# Patient Record
Sex: Female | Born: 2000 | Race: Black or African American | Hispanic: No | Marital: Single | Smoking: Never smoker
Health system: Southern US, Community
[De-identification: ages and names within clinical notes are randomized; demographics above are authoritative.]

## PROBLEM LIST (undated history)

## (undated) DIAGNOSIS — E119 Type 2 diabetes mellitus without complications: Secondary | ICD-10-CM

## (undated) HISTORY — PX: FOOT SURGERY: SHX648

## (undated) HISTORY — PX: TONSILLECTOMY: SUR1361

---
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
## 2019-02-21 ENCOUNTER — Encounter: Payer: Self-pay | Admitting: Dietician

## 2019-02-21 ENCOUNTER — Encounter: Payer: BC Managed Care – PPO | Attending: Internal Medicine | Admitting: Dietician

## 2019-02-21 ENCOUNTER — Other Ambulatory Visit: Payer: Self-pay

## 2019-02-21 VITALS — BP 112/76 | Ht 63.0 in | Wt 207.9 lb

## 2019-02-21 DIAGNOSIS — Z794 Long term (current) use of insulin: Secondary | ICD-10-CM

## 2019-02-21 DIAGNOSIS — E119 Type 2 diabetes mellitus without complications: Secondary | ICD-10-CM | POA: Insufficient documentation

## 2019-02-21 DIAGNOSIS — Z713 Dietary counseling and surveillance: Secondary | ICD-10-CM | POA: Diagnosis not present

## 2019-02-21 DIAGNOSIS — Z68.41 Body mass index (BMI) pediatric, greater than or equal to 95th percentile for age: Secondary | ICD-10-CM | POA: Insufficient documentation

## 2019-02-21 NOTE — Patient Instructions (Addendum)
     Bring Dexcom blood sugar records to classes  Exercise:  Continue kickboxing for    60 minutes   3 times a week                         -ride bike or walk 30 min. 1-2 days/wk  Eat 3 meals day and   1  snack a day at bedtime  Space meals 4-5 hours apart  Avoid sugar sweetened drinks (soda, tea, coffee, sports drinks, juices)  Limit intake of fried foods, snack foods and sweets/desserts  Eat 2-3 carbohydrate servings/meal + protein  Eat 1 carbohydrate serving/snack + protein  Drink plenty of water  Make a dentist appointment  Get a Sharps container  Carry fast acting glucose and a snack at all times  Carry medical alert ID at all times  When able, take Novolog 15-20 min. before eating meals  Rotate injection sites  Return for appointment/classes on:  03-07-19

## 2019-02-21 NOTE — Progress Notes (Signed)
Diabetes Self-Management Education  Visit Type: First/Initial  Appt. Start Time: 1315 Appt. End Time: 6962  02/21/2019  [name], identified by name and date of birth, is a 18 y.o. female with a diagnosis of Diabetes: Type 2.   ASSESSMENT  Blood pressure 112/76, height 5\' 3"  (1.6 m), weight 207 lb 14.4 oz (94.3 kg). Body mass index is 36.83 kg/m.  Diabetes Self-Management Education - 02/21/19 1438      Visit Information   Visit Type  First/Initial      Initial Visit   Diabetes Type  Type 2      Health Coping   How would you rate your overall health?  Fair      Psychosocial Assessment   Patient Belief/Attitude about Diabetes  Motivated to manage diabetes    Self-care barriers  None    Self-management support  Doctor's office;Family    Other persons present  Patient    Patient Concerns  Glycemic Control;Medication;Weight Control   prevent complicatons, become more fit   Special Needs  None    Preferred Learning Style  Visual;Auditory    Learning Readiness  Ready    What is the last grade level you completed in school?  high school      Pre-Education Assessment   Patient understands the diabetes disease and treatment process.  Needs Review    Patient understands incorporating nutritional management into lifestyle.  Needs Review    Patient undertands incorporating physical activity into lifestyle.  Needs Review    Patient understands using medications safely.  Needs Review    Patient understands monitoring blood glucose, interpreting and using results  Needs Review   wears dexcom   Patient understands prevention, detection, and treatment of acute complications.  Needs Review    Patient understands prevention, detection, and treatment of chronic complications.  Needs Review    Patient understands how to develop strategies to address psychosocial issues.  Needs Review    Patient understands how to develop strategies to promote health/change behavior.  Needs Review       Complications   Last HgB A1C per patient/outside source  14 %    How often do you check your blood sugar?  --   wears dexcom-reports FBG's mostly 100's-150's and 2 hr pp BG's 200's; occasional low BG's   Have you had a dilated eye exam in the past 12 months?  No   08-2017   Have you had a dental exam in the past 12 months?  Yes   07-2018   Are you checking your feet?  No      Dietary Intake   Breakfast  eats 2 eggs with cheese and vegetables at 9a-10a    Snack (morning)  eats sunflower seeds or chocolate candy or sweets/snack foods at 12p    Lunch  eats meal leftovers or cereal with milk at 1p-2p    Snack (afternoon)  none    Dinner  usually eats meat, rice and beans at 6p-8p    Snack (evening)  occasionally eats sweets/desserts at 9p    Beverage(s)  drinks water 5x/day, fruit juice 1x/day, regular sodas 2-3x/day, milk x1/day      Exercise   Exercise Type  ADL's;Strenuous (running)   kickboxing 1 hr 3x/wk   How many days per week to you exercise?  3    How many minutes per day do you exercise?  60    Total minutes per week of exercise  180  Patient Education   Previous Diabetes Education  No    Nutrition management   Role of diet in the treatment of diabetes and the relationship between the three main macronutrients and blood glucose level;Food label reading, portion sizes and measuring food.;Carbohydrate counting    Physical activity and exercise   Role of exercise on diabetes management, blood pressure control and cardiac health.    Medications  Taught/reviewed insulin injection, site rotation, insulin storage and needle disposal.;Reviewed patients medication for diabetes, action, purpose, timing of dose and side effects.    Monitoring  Yearly dilated eye exam   reviewed use of dexcom and using clarity for BG reports   Acute complications  Taught treatment of hypoglycemia - the 15 rule.   gave pt 1 tube of glucose tablets for PRN use; gave pt medical alert ID card and  coupon   Chronic complications  Relationship between chronic complications and blood glucose control    Personal strategies to promote health  Lifestyle issues that need to be addressed for better diabetes care;Helped patient develop diabetes management plan for (enter comment)      Outcomes   Expected Outcomes  Demonstrated limited interest in learning.  Expect minimal changes       Individualized Plan for Diabetes Self-Management Training:   Learning Objective:  Patient will have a greater understanding of diabetes self-management. Patient education plan is to attend individual and/or group sessions per assessed needs and concerns.   Plan:   Patient Instructions    Bring Dexcom blood sugar records to classes  Exercise:  Continue kickboxing for    60 minutes   3 times a week                         -ride bike or walk 30 min. 1-2 days/wk  Eat 3 meals day and   1  snack a day at bedtime  Space meals 4-5 hours apart  Avoid sugar sweetened drinks (soda, tea, coffee, sports drinks, juices)  Limit intake of fried foods, snack foods and sweets/desserts  Eat 2-3 carbohydrate servings/meal + protein  Eat 1 carbohydrate serving/snack + protein  Drink plenty of water  Make a dentist  appointment  Get a Sharps container  Carry fast acting glucose and a snack at all times  Carry medical alert ID at all times  When able, take Novolog 15-20 min. before eating meals  Rotate injection sites  Return for appointment/classes on:  03-07-19   Expected Outcomes:  Demonstrated limited interest in learning.  Expect minimal changes  Education material provided: General meal planning guidelines, Food Group handout, low BG handout, medical alert ID card, medical alert necklace coupon  If problems or questions, patient to contact team via: [phone]  Future DSME appointment:  03-07-19

## 2019-03-07 ENCOUNTER — Other Ambulatory Visit: Payer: Self-pay

## 2019-03-07 ENCOUNTER — Encounter: Payer: Self-pay | Admitting: Dietician

## 2019-03-07 ENCOUNTER — Encounter: Payer: BC Managed Care – PPO | Attending: Internal Medicine | Admitting: Dietician

## 2019-03-07 VITALS — Ht 63.0 in | Wt 213.5 lb

## 2019-03-07 DIAGNOSIS — E119 Type 2 diabetes mellitus without complications: Secondary | ICD-10-CM | POA: Diagnosis not present

## 2019-03-07 DIAGNOSIS — Z794 Long term (current) use of insulin: Secondary | ICD-10-CM

## 2019-03-07 DIAGNOSIS — Z68.41 Body mass index (BMI) pediatric, greater than or equal to 95th percentile for age: Secondary | ICD-10-CM | POA: Diagnosis not present

## 2019-03-07 DIAGNOSIS — Z713 Dietary counseling and surveillance: Secondary | ICD-10-CM | POA: Diagnosis present

## 2019-03-07 NOTE — Progress Notes (Signed)
Appt. Start Time: 1730 Appt. End Time: 1950  Class 1 Diabetes Overview - define DM; state own type of DM; identify functions of pancreas and insulin; define insulin deficiency vs insulin resistance  Psychosocial - identify DM as a source of stress; state the effects of stress on BG control  Nutritional Management - describe effects of food on blood glucose; identify sources of carbohydrate, protein and fat; verbalize the importance of balance meals in controlling blood glucose  Exercise - describe the effects of exercise on blood glucose and importance of regular exercise in controlling diabetes; state a plan for personal exercise; verbalize contraindications for exercise  Self-Monitoring - state importance of SMBG; use SMBG results to effectively manage diabetes; identify importance of regular HbA1C testing and goals for results  Acute Complications - recognize hyperglycemia and hypoglycemia with causes and effects; identify blood glucose results as high, low or in control; list steps in treating and preventing high and low blood glucose  Chronic Complications/Foot, Skin, Eye Dental Care - identify possible long-term complications of diabetes (retinopathy, neuropathy, nephropathy, cardiovascular disease, infections); state importance of daily self-foot exams; describe how to examine feet and what to look for; explain appropriate eye and dental care  Lifestyle Changes/Goals & Health/Community Resources - state benefits of making appropriate lifestyle changes; identify habits that need to change (meals, tobacco, alcohol); identify strategies to reduce risk factors for personal health  Pregnancy/Sexual Health - define gestational diabetes; state importance of good blood glucose control and birth control prior to pregnancy; state importance of good blood glucose control in preventing sexual problems (impotence, vaginal dryness, infections, loss of desire); state relationship of blood glucose control  and pregnancy outcome; describe risk of maternal and fetal complications  Teaching Materials Used: Class 1 Slides/Notebook Diabetes Booklet ID Card  Medic Alert/Medic ID Forms Sleep Evaluation Exercise Handout Planning a Balanced Meal Goals for Class 1

## 2019-03-14 ENCOUNTER — Other Ambulatory Visit: Payer: Self-pay

## 2019-03-14 ENCOUNTER — Encounter: Payer: Self-pay | Admitting: Dietician

## 2019-03-14 NOTE — Progress Notes (Signed)
Patient did not come for class 2 as scheduled for today. Attempted to reach patient by phone but call was unable to be completed.

## 2019-03-17 ENCOUNTER — Telehealth: Payer: Self-pay | Admitting: Dietician

## 2019-03-17 NOTE — Telephone Encounter (Signed)
Called patient to reschedule her diabetes class 2, which she missed on 03/14/19. Received a busy signal after several rings.

## 2019-03-21 ENCOUNTER — Telehealth: Payer: Self-pay | Admitting: Dietician

## 2019-03-21 ENCOUNTER — Other Ambulatory Visit: Payer: Self-pay

## 2019-03-21 NOTE — Telephone Encounter (Signed)
Patient did not come for her Diabetes Self management class 3. Called and phone rings and then goes to "busy" signal.

## 2019-03-28 ENCOUNTER — Encounter: Payer: Self-pay | Admitting: Dietician

## 2019-03-28 NOTE — Progress Notes (Signed)
Have not heard from pt after attempts were made on 03-14-19, 03-17-19 and 03-21-19 to call her-each time her phone rings and then goes to a busy signal

## 2019-12-13 ENCOUNTER — Encounter (INDEPENDENT_AMBULATORY_CARE_PROVIDER_SITE_OTHER): Payer: Self-pay

## 2019-12-13 ENCOUNTER — Ambulatory Visit
Admission: RE | Admit: 2019-12-13 | Discharge: 2019-12-13 | Disposition: A | Discharge status: Home / Self Care | Payer: Medicaid Other | Source: Ambulatory Visit | Attending: Family Medicine | Admitting: Family Medicine

## 2019-12-13 ENCOUNTER — Other Ambulatory Visit: Payer: Self-pay

## 2019-12-13 ENCOUNTER — Other Ambulatory Visit: Payer: Self-pay | Admitting: Family Medicine

## 2019-12-13 DIAGNOSIS — R1011 Right upper quadrant pain: Secondary | ICD-10-CM | POA: Insufficient documentation

## 2021-03-16 ENCOUNTER — Other Ambulatory Visit: Payer: Self-pay

## 2021-03-16 ENCOUNTER — Emergency Department
Admission: EM | Admit: 2021-03-16 | Discharge: 2021-03-16 | Disposition: A | Discharge status: Home / Self Care | Payer: Medicaid Other | Attending: Student in an Organized Health Care Education/Training Program | Admitting: Student in an Organized Health Care Education/Training Program

## 2021-03-16 ENCOUNTER — Encounter: Payer: Self-pay | Admitting: Emergency Medicine

## 2021-03-16 DIAGNOSIS — W4904XA Ring or other jewelry causing external constriction, initial encounter: Secondary | ICD-10-CM | POA: Diagnosis not present

## 2021-03-16 DIAGNOSIS — S00451A Superficial foreign body of right ear, initial encounter: Secondary | ICD-10-CM

## 2021-03-16 DIAGNOSIS — E119 Type 2 diabetes mellitus without complications: Secondary | ICD-10-CM | POA: Insufficient documentation

## 2021-03-16 DIAGNOSIS — Z794 Long term (current) use of insulin: Secondary | ICD-10-CM | POA: Diagnosis not present

## 2021-03-16 DIAGNOSIS — S0991XA Unspecified injury of ear, initial encounter: Secondary | ICD-10-CM | POA: Diagnosis present

## 2021-03-16 MED ORDER — LIDOCAINE HCL (PF) 1 % IJ SOLN
5.0000 mL | Freq: Once | INTRAMUSCULAR | Status: AC
  Administered 2021-03-16: 5 mL
  Filled 2021-03-16: qty 5

## 2021-03-16 NOTE — ED Provider Notes (Signed)
[facility] Emergency Department Provider Note ____________________________________________  Time seen: 1654  I have reviewed the triage vital signs and the nursing notes.  HISTORY  Chief Complaint  Foreign Body in Skin   HPI Donna Mcguire is a 20 y.o. female presents to the ED for evaluation of an embedded earring to the right earlobe.  Patient reports a stud piercing to the pinna of the ear, has been present for the last year.  She notes over the last 24 hours, after sleeping on her right side, she noted some edema to the ear, and the inability to remove the earring comfortably.  She presents now with some local bleeding, swelling, and concern for overgrowth of skin over the earring stud.  She denies any other injury at this time.  Past Medical History:  Diagnosis Date   Diabetes mellitus without complication (HCC)     There are no problems to display for this patient.   Past Surgical History:  Procedure Laterality Date   TONSILLECTOMY      Prior to Admission medications   Medication Sig Start Date End Date Taking? Authorizing Provider  glucagon 1 MG injection Inject 1 mg into the muscle as needed. 11/05/15   [provider]  insulin aspart (NOVOLOG FLEXPEN) 100 UNIT/ML FlexPen Inject 20 Units into the skin 3 (three) times daily before meals. Patient taking 20 units before breakfast and lunch, 30 units before supper 02/26/18   [provider]  Insulin Glargine (LANTUS SOLOSTAR) 100 UNIT/ML Solostar Pen Inject 40 Units into the skin at bedtime.    [provider]  Vitamin D, Ergocalciferol, (DRISDOL) 1.25 MG (50000 UT) CAPS capsule Take 50,000 Units by mouth once a week. 01/27/18   [provider]    Allergies Patient has no known allergies.  History reviewed. No pertinent family history.  Social History Social History   Tobacco Use   Smoking status: Never   Smokeless tobacco: Never  Substance Use Topics   Alcohol  use: Never    Review of Systems  Constitutional: Negative for fever. Eyes: Negative for visual changes. ENT: Negative for sore throat.  Right earlobe foreign body as above. Cardiovascular: Negative for chest pain. Respiratory: Negative for shortness of breath. Gastrointestinal: Negative for abdominal pain, vomiting and diarrhea. Genitourinary: Negative for dysuria. Musculoskeletal: Negative for back pain. Skin: Negative for rash. Neurological: Negative for headaches, focal weakness or numbness. ____________________________________________  PHYSICAL EXAM:  VITAL SIGNS: ED Triage Vitals  Enc Vitals Group     BP 03/16/21 1430 130/82     Pulse Rate 03/16/21 1430 87     Resp 03/16/21 1430 20     Temp 03/16/21 1430 98.3 F (36.8 C)     Temp Source 03/16/21 1430 Oral     SpO2 03/16/21 1430 99 %     Weight 03/16/21 1429 228 lb (103.4 kg)     Height 03/16/21 1429 5\' 3"  (1.6 m)     Head Circumference --      Peak Flow --      Pain Score 03/16/21 1429 6     Pain Loc --      Pain Edu? --      Excl. in GC? --     Constitutional: Alert and oriented. Well appearing and in no distress. Head: Normocephalic and atraumatic. Eyes: Conjunctivae are normal. Normal extraocular movements Ears: Canals clear. TMs intact bilaterally.  Right ear with an embedded earring to the pinna with surrounding dried blood.  The follow-up of  the earring is partially obscured by hypertrophic skin. Cardiovascular: Normal rate, regular rhythm. Normal distal pulses. Respiratory: Normal respiratory effort.  Musculoskeletal: Nontender with normal range of motion in all extremities.  Neurologic:  Normal gait without ataxia. Normal speech and language. No gross focal neurologic deficits are appreciated. Skin:  Skin is warm, dry and intact. No rash noted. ____________________________________________   RADIOLOGY Official radiology report(s): No results  found. ____________________________________________  PROCEDURES   .Foreign Body Removal  Date/Time: 03/16/2021 4:36 PM Performed by: Lissa Hoard, PA-C Authorized by: Lissa Hoard, PA-C  Consent: Verbal consent obtained. Written consent not obtained. Risks and benefits: risks, benefits and alternatives were discussed Consent given by: patient Patient understanding: patient states understanding of the procedure being performed Patient consent: the patient's understanding of the procedure matches consent given Site marked: the operative site was marked Patient identity confirmed: verbally with patient Body area: ear Location details: right ear Anesthesia: local infiltration  Anesthesia: Local Anesthetic: lidocaine 1% without epinephrine Anesthetic total: 0.7 mL  Sedation: Patient sedated: no  Patient restrained: no Patient cooperative: yes Localization method: visualized Removal mechanism: forceps Complexity: simple 1 objects recovered. Objects recovered: silver-tone stud earring w/ back Post-procedure assessment: foreign body removed Patient tolerance: patient tolerated the procedure well with no immediate complications  ____________________________________________   INITIAL IMPRESSION / ASSESSMENT AND PLAN / ED COURSE  As part of my medical decision making, I reviewed the following data within the electronic MEDICAL RECORD NUMBER Notes from prior ED visits    Patient with ED evaluation of embedded earring to the pinna of the right earlobe.  Patient presents for evaluation of her complaint.  The foreign body removed successfully without difficulty.  Patient discharged with instruction to monitor for any skin changes.  She will follow with primary provider or return to the ED if needed.    Donna Mcguire was evaluated in Emergency Department on 03/16/2021 for the symptoms described in the history of present illness. She was evaluated in the context of  the global COVID-19 pandemic, which necessitated consideration that the patient might be at risk for infection with the SARS-CoV-2 virus that causes COVID-19. Institutional protocols and algorithms that pertain to the evaluation of patients at risk for COVID-19 are in a state of rapid change based on information released by regulatory bodies including the CDC and federal and state organizations. These policies and algorithms were followed during the patient's care in the ED. ____________________________________________  FINAL CLINICAL IMPRESSION(S) / ED DIAGNOSES  Final diagnoses:  Embedded earring of right ear, initial encounter      Lissa Hoard, PA-C 03/16/21 1759    Willy Eddy, MD 03/16/21 [phone]

## 2021-03-16 NOTE — Discharge Instructions (Addendum)
We successfully removed an embedded earring from the ear. Keep the wound clean.

## 2021-03-16 NOTE — ED Provider Notes (Signed)
Emergency Medicine Provider Triage Evaluation Note  Donna Mcguire , a 20 y.o. female  was evaluated in triage.  Pt complains of pain in the right ear due to earring. Skin has grown over it and she has tried to get it out without success. Earrings have been in place for the last year, but she noticed the overgrown skin today.  Review of Systems  Positive: FB right ear Negative: Fever  Physical Exam  BP 130/82 (BP Location: Left Arm)   Pulse 87   Temp 98.3 F (36.8 C) (Oral)   Resp 20   Ht 5\' 3"  (1.6 m)   Wt 103.4 kg   LMP 02/23/2021 (Approximate)   SpO2 99%   BMI 40.39 kg/m  Gen:   Awake, no distress   Resp:  Normal effort  MSK:   Moves extremities without difficulty  Other:  Unable to easily visualize reported FB. Dried blood noted on auricle of right ear.  Medical Decision Making  Medically screening exam initiated at 2:31 PM.  Appropriate orders placed.  Donna Mcguire was informed that the remainder of the evaluation will be completed by another provider, this initial triage assessment does not replace that evaluation, and the importance of remaining in the ED until their evaluation is complete.    Donna Payment, FNP 03/16/21 1529    03/18/21, MD 03/16/21 [phone]

## 2021-04-02 ENCOUNTER — Encounter (HOSPITAL_BASED_OUTPATIENT_CLINIC_OR_DEPARTMENT_OTHER): Payer: Self-pay | Admitting: *Deleted

## 2021-04-02 ENCOUNTER — Other Ambulatory Visit: Payer: Self-pay

## 2021-04-02 ENCOUNTER — Emergency Department (HOSPITAL_BASED_OUTPATIENT_CLINIC_OR_DEPARTMENT_OTHER)
Admission: EM | Admit: 2021-04-02 | Discharge: 2021-04-02 | Disposition: A | Discharge status: Home / Self Care | Payer: Medicaid Other | Attending: Emergency Medicine | Admitting: Emergency Medicine

## 2021-04-02 ENCOUNTER — Emergency Department (HOSPITAL_BASED_OUTPATIENT_CLINIC_OR_DEPARTMENT_OTHER): Payer: Medicaid Other

## 2021-04-02 DIAGNOSIS — R0789 Other chest pain: Secondary | ICD-10-CM | POA: Diagnosis not present

## 2021-04-02 DIAGNOSIS — Z794 Long term (current) use of insulin: Secondary | ICD-10-CM | POA: Diagnosis not present

## 2021-04-02 DIAGNOSIS — Z7722 Contact with and (suspected) exposure to environmental tobacco smoke (acute) (chronic): Secondary | ICD-10-CM | POA: Insufficient documentation

## 2021-04-02 DIAGNOSIS — E119 Type 2 diabetes mellitus without complications: Secondary | ICD-10-CM | POA: Diagnosis not present

## 2021-04-02 DIAGNOSIS — M5412 Radiculopathy, cervical region: Secondary | ICD-10-CM | POA: Insufficient documentation

## 2021-04-02 DIAGNOSIS — R202 Paresthesia of skin: Secondary | ICD-10-CM | POA: Diagnosis not present

## 2021-04-02 LAB — BASIC METABOLIC PANEL
Anion gap: 9 (ref 5–15)
BUN: 17 mg/dL (ref 6–20)
CO2: 26 mmol/L (ref 22–32)
Calcium: 9.4 mg/dL (ref 8.9–10.3)
Chloride: 102 mmol/L (ref 98–111)
Creatinine, Ser: 0.75 mg/dL (ref 0.44–1.00)
GFR, Estimated: >60 mL/min (ref >60)
Glucose, Bld: 125 mg/dL — ABNORMAL HIGH (ref 70–99)
Potassium: 4.1 mmol/L (ref 3.5–5.1)
Sodium: 137 mmol/L (ref 135–145)

## 2021-04-02 LAB — TROPONIN I (HIGH SENSITIVITY): Troponin I (High Sensitivity): <2 ng/L (ref <18)

## 2021-04-02 LAB — CBC
HCT: 39 % (ref 36.0–46.0)
Hemoglobin: 12.2 g/dL (ref 12.0–15.0)
MCH: 23.9 pg — ABNORMAL LOW (ref 26.0–34.0)
MCHC: 31.3 g/dL (ref 30.0–36.0)
MCV: 76.3 fL — ABNORMAL LOW (ref 80.0–100.0)
Platelets: 472 10*3/uL — ABNORMAL HIGH (ref 150–400)
RBC: 5.11 MIL/uL (ref 3.87–5.11)
RDW: 14.5 % (ref 11.5–15.5)
WBC: 8.6 10*3/uL (ref 4.0–10.5)
nRBC: 0 % (ref 0.0–0.2)

## 2021-04-02 LAB — PREGNANCY, URINE: Preg Test, Ur: NEGATIVE

## 2021-04-02 MED ORDER — METHOCARBAMOL 500 MG PO TABS: 500.0000 mg | ORAL_TABLET | Freq: Two times a day (BID) | ORAL | 0 refills | Status: DC

## 2021-04-02 MED ORDER — MELOXICAM 15 MG PO TABS: 15.0000 mg | ORAL_TABLET | Freq: Every day | ORAL | 0 refills | Status: DC

## 2021-04-02 MED ORDER — METHOCARBAMOL 500 MG PO TABS: 500.0000 mg | ORAL_TABLET | Freq: Two times a day (BID) | ORAL | 0 refills | Status: AC

## 2021-04-02 NOTE — ED Notes (Signed)
Pt placed in a gown and hooked up to the monitor with a 5 lead, BP cuff and pulse ox 

## 2021-04-02 NOTE — ED Provider Notes (Signed)
[facility] EMERGENCY DEPARTMENT Provider Note   CSN: [id number] Arrival date & time: 04/02/21  2108     History No chief complaint on file.   Donna Mcguire is a 20 y.o. female.  Pt presents to the ED today with cp.  She said she had pain in her chest radiating down her left arm.  [facility] went to [facility] to check [facility] and it was high.  She became worried, so came here.  Pt is feeling better now, but still has some tingling down the left arm.  No sob.  No f/c.  No cough.      Past Medical History:  Diagnosis Date   Diabetes mellitus without complication (HCC)     There are no problems to display for this patient.   Past Surgical History:  Procedure Laterality Date   TONSILLECTOMY       OB History   No obstetric history on file.     No family history on file.  Social History   Tobacco Use   Smoking status: Never    Passive exposure: Current   Smokeless tobacco: Never  Vaping Use   Vaping Use: Never used  Substance Use Topics   Alcohol use: Yes   Drug use: Never    Home Medications Prior to Admission medications   Medication Sig Start Date End Date Taking? Authorizing Provider  insulin aspart (NOVOLOG FLEXPEN) 100 UNIT/ML FlexPen Inject 20 Units into the skin 3 (three) times daily before meals. Patient taking 20 units before breakfast and lunch, 30 units before supper 02/26/18  Yes [provider]  Insulin Glargine (LANTUS SOLOSTAR) 100 UNIT/ML Solostar Pen Inject 40 Units into the skin at bedtime.   Yes [provider]  meloxicam (MOBIC) 15 MG tablet Take 1 tablet (15 mg total) by mouth daily. 04/02/21  Yes Jacalyn Lefevre, MD  methocarbamol (ROBAXIN) 500 MG tablet Take 1 tablet (500 mg total) by mouth 2 (two) times daily. 04/02/21  Yes Jacalyn Lefevre, MD  Vitamin D, Ergocalciferol, (DRISDOL) 1.25 MG (50000 UT) CAPS capsule Take 50,000 Units by mouth once a week. 01/27/18  Yes [provider]  glucagon 1 MG injection Inject 1 mg into  the muscle as needed. 11/05/15   [provider]    Allergies    Patient has no known allergies.  Review of Systems   Review of Systems  Cardiovascular:  Positive for chest pain.  All other systems reviewed and are negative.  Physical Exam Updated Vital Signs BP (!) 139/99 (BP Location: Right Arm)   Pulse (!) 101   Temp 98.2 F (36.8 C) (Oral)   Resp 20   Ht 5\' 3"  (1.6 m)   Wt 103.4 kg   LMP 03/26/2021   SpO2 99%   BMI 40.38 kg/m   Physical Exam Vitals and nursing note reviewed.  Constitutional:      Appearance: Normal appearance. She is obese.  HENT:     Head: Normocephalic and atraumatic.     Right Ear: External ear normal.     Left Ear: External ear normal.     Nose: Nose normal.     Mouth/Throat:     Mouth: Mucous membranes are moist.     Pharynx: Oropharynx is clear.  Eyes:     Extraocular Movements: Extraocular movements intact.     Conjunctiva/sclera: Conjunctivae normal.     Pupils: Pupils are equal, round, and reactive to light.  Cardiovascular:     Rate and Rhythm: Normal rate  and regular rhythm.     Pulses: Normal pulses.     Heart sounds: Normal heart sounds.  Pulmonary:     Effort: Pulmonary effort is normal.     Breath sounds: Normal breath sounds.  Abdominal:     General: Abdomen is flat. Bowel sounds are normal.     Palpations: Abdomen is soft.  Musculoskeletal:        General: Normal range of motion.     Cervical back: Normal range of motion and neck supple.  Skin:    General: Skin is warm.     Capillary Refill: Capillary refill takes less than 2 seconds.  Neurological:     General: No focal deficit present.     Mental Status: She is alert and oriented to person, place, and time.  Psychiatric:        Mood and Affect: Mood normal.        Behavior: Behavior normal.    ED Results / Procedures / Treatments   Labs (all labs ordered are listed, but only abnormal results are displayed) Labs Reviewed  BASIC METABOLIC PANEL -  Abnormal; Notable for the following components:      Result Value   Glucose, Bld 125 (*)    All other components within normal limits  CBC - Abnormal; Notable for the following components:   MCV 76.3 (*)    MCH 23.9 (*)    Platelets 472 (*)    All other components within normal limits  PREGNANCY, URINE  TROPONIN I (HIGH SENSITIVITY)    EKG EKG Interpretation  Date/Time:  Tuesday April 02 2021 21:17:44 EDT Ventricular Rate:  100 PR Interval:  128 QRS Duration: 82 QT Interval:  316 QTC Calculation: 407 R Axis:   89 Text Interpretation: Normal sinus rhythm Nonspecific T wave abnormality Abnormal ECG No old tracing to compare Confirmed by Jacalyn Lefevre [phone]) on 04/02/2021 10:13:53 PM  Radiology DG Chest 2 View  Result Date: 04/02/2021 CLINICAL DATA:  Chest pain EXAM: CHEST - 2 VIEW COMPARISON:  None. FINDINGS: The heart size and mediastinal contours are within normal limits. Both lungs are clear. The visualized skeletal structures are unremarkable. IMPRESSION: Normal study. Electronically Signed   By: Charlett Nose M.D.   On: 04/02/2021 21:41    Procedures Procedures   Medications Ordered in ED Medications - No data to display  ED Course  I have reviewed the triage vital signs and the nursing notes.  Pertinent labs & imaging results that were available during my care of the patient were reviewed by me and considered in my medical decision making (see chart for details).    MDM Rules/Calculators/A&P                           Cardiac eval negative.  She has been under a lot of stress.  She most likely has cervical radiculopathy.  She is stable for d/c.  Return if worse.  F/u with pcp.  Final Clinical Impression(s) / ED Diagnoses Final diagnoses:  Atypical chest pain  Cervical radiculopathy    Rx / DC Orders ED Discharge Orders          Ordered    methocarbamol (ROBAXIN) 500 MG tablet  2 times daily        04/02/21 2233    meloxicam (MOBIC) 15 MG tablet  Daily         04/02/21 2233  Jacalyn Lefevre, MD 04/02/21 2236

## 2021-04-02 NOTE — ED Triage Notes (Signed)
Burning pain in her left arm, neck, shoulder and chest. [facility] so [facility] came here. EKG at triage.

## 2021-07-19 ENCOUNTER — Encounter (HOSPITAL_BASED_OUTPATIENT_CLINIC_OR_DEPARTMENT_OTHER): Payer: Self-pay | Admitting: *Deleted

## 2021-07-19 ENCOUNTER — Other Ambulatory Visit: Payer: Self-pay

## 2021-07-19 ENCOUNTER — Emergency Department (HOSPITAL_BASED_OUTPATIENT_CLINIC_OR_DEPARTMENT_OTHER): Payer: Medicaid Other

## 2021-07-19 ENCOUNTER — Emergency Department (HOSPITAL_BASED_OUTPATIENT_CLINIC_OR_DEPARTMENT_OTHER)
Admission: EM | Admit: 2021-07-19 | Discharge: 2021-07-20 | Disposition: A | Discharge status: Home / Self Care | Payer: Medicaid Other | Attending: Emergency Medicine | Admitting: Emergency Medicine

## 2021-07-19 DIAGNOSIS — E119 Type 2 diabetes mellitus without complications: Secondary | ICD-10-CM | POA: Diagnosis not present

## 2021-07-19 DIAGNOSIS — Z794 Long term (current) use of insulin: Secondary | ICD-10-CM | POA: Diagnosis not present

## 2021-07-19 DIAGNOSIS — R197 Diarrhea, unspecified: Secondary | ICD-10-CM

## 2021-07-19 DIAGNOSIS — Z20822 Contact with and (suspected) exposure to covid-19: Secondary | ICD-10-CM | POA: Diagnosis not present

## 2021-07-19 DIAGNOSIS — R1011 Right upper quadrant pain: Secondary | ICD-10-CM | POA: Diagnosis present

## 2021-07-19 DIAGNOSIS — Z7984 Long term (current) use of oral hypoglycemic drugs: Secondary | ICD-10-CM | POA: Diagnosis not present

## 2021-07-19 DIAGNOSIS — R112 Nausea with vomiting, unspecified: Secondary | ICD-10-CM | POA: Diagnosis not present

## 2021-07-19 LAB — COMPREHENSIVE METABOLIC PANEL
ALT: 15 U/L (ref 0–44)
AST: 20 U/L (ref 15–41)
Albumin: 3.5 g/dL (ref 3.5–5.0)
Alkaline Phosphatase: 97 U/L (ref 38–126)
Anion gap: 8 (ref 5–15)
BUN: 11 mg/dL (ref 6–20)
CO2: 25 mmol/L (ref 22–32)
Calcium: 9.2 mg/dL (ref 8.9–10.3)
Chloride: 100 mmol/L (ref 98–111)
Creatinine, Ser: 0.81 mg/dL (ref 0.44–1.00)
GFR, Estimated: >60 mL/min (ref >60)
Glucose, Bld: 309 mg/dL — ABNORMAL HIGH (ref 70–99)
Potassium: 4 mmol/L (ref 3.5–5.1)
Sodium: 133 mmol/L — ABNORMAL LOW (ref 135–145)
Total Bilirubin: 0.3 mg/dL (ref 0.3–1.2)
Total Protein: 8 g/dL (ref 6.5–8.1)

## 2021-07-19 LAB — CBC
HCT: 36.6 % (ref 36.0–46.0)
Hemoglobin: 11.4 g/dL — ABNORMAL LOW (ref 12.0–15.0)
MCH: 23.7 pg — ABNORMAL LOW (ref 26.0–34.0)
MCHC: 31.1 g/dL (ref 30.0–36.0)
MCV: 76.1 fL — ABNORMAL LOW (ref 80.0–100.0)
Platelets: 452 10*3/uL — ABNORMAL HIGH (ref 150–400)
RBC: 4.81 MIL/uL (ref 3.87–5.11)
RDW: 14.6 % (ref 11.5–15.5)
WBC: 10 10*3/uL (ref 4.0–10.5)
nRBC: 0 % (ref 0.0–0.2)

## 2021-07-19 LAB — URINALYSIS, MICROSCOPIC (REFLEX): RBC / HPF: NONE SEEN RBC/hpf (ref 0–5)

## 2021-07-19 LAB — URINALYSIS, ROUTINE W REFLEX MICROSCOPIC
Bilirubin Urine: NEGATIVE
Glucose, UA: >=500 mg/dL — AB
Hgb urine dipstick: NEGATIVE
Ketones, ur: NEGATIVE mg/dL
Leukocytes,Ua: NEGATIVE
Nitrite: NEGATIVE
Protein, ur: NEGATIVE mg/dL
Specific Gravity, Urine: 1.02 (ref 1.005–1.030)
pH: 6.5 (ref 5.0–8.0)

## 2021-07-19 LAB — RESP PANEL BY RT-PCR (FLU A&B, COVID) ARPGX2
Influenza A by PCR: NEGATIVE
Influenza B by PCR: NEGATIVE
SARS Coronavirus 2 by RT PCR: NEGATIVE

## 2021-07-19 LAB — PREGNANCY, URINE: Preg Test, Ur: NEGATIVE

## 2021-07-19 LAB — LIPASE, BLOOD: Lipase: 28 U/L (ref 11–51)

## 2021-07-19 MED ORDER — DICYCLOMINE HCL 20 MG PO TABS: 20.0000 mg | ORAL_TABLET | Freq: Two times a day (BID) | ORAL | 0 refills | Status: AC | PRN

## 2021-07-19 MED ORDER — ONDANSETRON HCL 4 MG/2ML IJ SOLN
4.0000 mg | Freq: Once | INTRAMUSCULAR | Status: AC
  Administered 2021-07-19: 4 mg via INTRAVENOUS
  Filled 2021-07-19: qty 2

## 2021-07-19 MED ORDER — ONDANSETRON HCL 4 MG PO TABS: 4.0000 mg | ORAL_TABLET | Freq: Four times a day (QID) | ORAL | 0 refills | Status: DC

## 2021-07-19 MED ORDER — DICYCLOMINE HCL 10 MG/ML IM SOLN
20.0000 mg | Freq: Once | INTRAMUSCULAR | Status: AC
  Administered 2021-07-19: 20 mg via INTRAMUSCULAR
  Filled 2021-07-19: qty 2

## 2021-07-19 MED ORDER — HYDROMORPHONE HCL 1 MG/ML IJ SOLN
1.0000 mg | Freq: Once | INTRAMUSCULAR | Status: AC
  Administered 2021-07-19: 1 mg via INTRAVENOUS
  Filled 2021-07-19: qty 1

## 2021-07-19 MED ORDER — IOHEXOL 300 MG/ML  SOLN
100.0000 mL | Freq: Once | INTRAMUSCULAR | Status: AC | PRN
  Administered 2021-07-19: 100 mL via INTRAVENOUS

## 2021-07-19 MED ORDER — SODIUM CHLORIDE 0.9 % IV BOLUS
1000.0000 mL | Freq: Once | INTRAVENOUS | Status: AC
  Administered 2021-07-19: 1000 mL via INTRAVENOUS

## 2021-07-19 MED ORDER — ALUM & MAG HYDROXIDE-SIMETH 200-200-20 MG/5ML PO SUSP
30.0000 mL | Freq: Once | ORAL | Status: AC
  Administered 2021-07-19: 30 mL via ORAL
  Filled 2021-07-19: qty 30

## 2021-07-19 MED ORDER — ONDANSETRON 4 MG PO TBDP
8.0000 mg | ORAL_TABLET | Freq: Once | ORAL | Status: AC
  Administered 2021-07-19: 8 mg via ORAL
  Filled 2021-07-19: qty 2

## 2021-07-19 MED ORDER — LIDOCAINE VISCOUS HCL 2 % MT SOLN
15.0000 mL | Freq: Once | OROMUCOSAL | Status: AC
  Administered 2021-07-19: 15 mL via ORAL
  Filled 2021-07-19: qty 15

## 2021-07-19 NOTE — Discharge Instructions (Signed)
Likely you have a viral infection of your abdomen, recommend bowel rest, i.e. liquid diet for further next couple days, as you tolerate this you may move into a soft diet and then finally into a normal diet.  Given you Bentyl for stomach spasms, Zofran for nausea.  Imaging shows that you have an abnormality in your liver, please follow-up with your PCP for further evaluation.  Come back to the emergency department if you develop chest pain, shortness of breath, severe abdominal pain, uncontrolled nausea, vomiting, diarrhea.

## 2021-07-19 NOTE — ED Provider Notes (Signed)
[facility] EMERGENCY DEPARTMENT Provider Note   CSN: [id number] Arrival date & time: 07/19/21  1834     History Chief Complaint  Patient presents with   Abdominal Pain    Donna Mcguire is a 20 y.o. female.  HPI  Patient with significant medical history including diabetes presents with chief complaint of right upper quadrant pain.  Patient states she has been having intermittent pain for last 5 months, but has had a flareup of this pain for last 2 days, pain started last night, came on suddenly, states she has pain in her right upper quadrant, does not radiate, describes it as a burning-like pressure-like sensation, does not radiate, pain is been constant, has had associated nausea without vomiting, and diarrhea.  Denies hematochezia, melena, no significant abdominal history, no history of PUD, recent alcohol use, NSAID use, bowel obstructions, biliary abnormalities.  She denies  alleviating or aggravating factors, has no associated fevers, chills, chest pain, shortness of breath, general body aches.  No urinary symptoms.  Past Medical History:  Diagnosis Date   Diabetes mellitus without complication ([place])     There are no problems to display for this patient.   Past Surgical History:  Procedure Laterality Date   TONSILLECTOMY       OB History   No obstetric history on file.     No family history on file.  Social History   Tobacco Use   Smoking status: Never    Passive exposure: Current   Smokeless tobacco: Never  Vaping Use   Vaping Use: Never used  Substance Use Topics   Alcohol use: Yes   Drug use: Never    Home Medications Prior to Admission medications   Medication Sig Start Date End Date Taking? Authorizing Provider  dicyclomine (BENTYL) 20 MG tablet Take 1 tablet (20 mg total) by mouth 2 (two) times daily as needed for spasms. 07/19/21  Yes Marcello Fennel, PA-C  insulin aspart (NOVOLOG FLEXPEN) 100 UNIT/ML FlexPen Inject 20 Units into  the skin 3 (three) times daily before meals. Patient taking 20 units before breakfast and lunch, 30 units before supper 02/26/18  Yes [provider]  Insulin Glargine (LANTUS SOLOSTAR) 100 UNIT/ML Solostar Pen Inject 40 Units into the skin at bedtime.   Yes [provider]  metFORMIN (GLUCOPHAGE-XR) 500 MG 24 hr tablet Take by mouth. 04/10/21 04/10/22 Yes [provider]  ondansetron (ZOFRAN) 4 MG tablet Take 1 tablet (4 mg total) by mouth every 6 (six) hours. 07/19/21  Yes Marcello Fennel, PA-C  Vitamin D, Ergocalciferol, (DRISDOL) 1.25 MG (50000 UT) CAPS capsule Take 50,000 Units by mouth once a week. 01/27/18  Yes [provider]  glucagon 1 MG injection Inject 1 mg into the muscle as needed. 11/05/15   [provider]  meloxicam (MOBIC) 15 MG tablet Take 1 tablet (15 mg total) by mouth daily. 04/02/21   Isla Pence, MD  methocarbamol (ROBAXIN) 500 MG tablet Take 1 tablet (500 mg total) by mouth 2 (two) times daily. 04/02/21   Isla Pence, MD    Allergies    Patient has no known allergies.  Review of Systems   Review of Systems  Constitutional:  Negative for chills and fever.  HENT:  Negative for congestion.   Respiratory:  Negative for shortness of breath.   Cardiovascular:  Negative for chest pain.  Gastrointestinal:  Positive for abdominal pain, diarrhea and nausea. Negative for constipation and vomiting.  Genitourinary:  Negative for dysuria, enuresis and  flank pain.  Musculoskeletal:  Negative for back pain.  Skin:  Negative for rash.  Neurological:  Negative for dizziness.  Hematological:  Does not bruise/bleed easily.   Physical Exam Updated Vital Signs BP (!) 142/81   Pulse 90   Temp 98.2 F (36.8 C) (Oral)   Resp 18   Ht _0  (1.6 m)   Wt 103.4 kg   LMP 07/12/2021   SpO2 97%   BMI 40.38 kg/m   Physical Exam Vitals and nursing note reviewed.  Constitutional:      General: She is not in acute distress.     Appearance: She is not ill-appearing.  HENT:     Head: Normocephalic and atraumatic.     Nose: No congestion.  Eyes:     Conjunctiva/sclera: Conjunctivae normal.  Cardiovascular:     Rate and Rhythm: Normal rate and regular rhythm.     Pulses: Normal pulses.     Heart sounds: No murmur heard.   No friction rub. No gallop.  Pulmonary:     Effort: No respiratory distress.     Breath sounds: No wheezing, rhonchi or rales.  Abdominal:     Palpations: Abdomen is soft.     Tenderness: There is abdominal tenderness. There is no right CVA tenderness or left CVA tenderness.     Comments: Abdomen nondistended normal bowel sounds, dull to percussion, she has noted epigastric and right upper quadrant pain, no guarding, rebound tenderness, peritoneal sign, negative Murphy's or McBurney point, no CVA tenderness.  Musculoskeletal:     Right lower leg: No edema.     Left lower leg: No edema.  Skin:    General: Skin is warm and dry.  Neurological:     Mental Status: She is alert.  Psychiatric:        Mood and Affect: Mood normal.    ED Results / Procedures / Treatments   Labs (all labs ordered are listed, but only abnormal results are displayed) Labs Reviewed  COMPREHENSIVE METABOLIC PANEL - Abnormal; Notable for the following components:      Result Value   Sodium 133 (*)    Glucose, Bld 309 (*)    All other components within normal limits  CBC - Abnormal; Notable for the following components:   Hemoglobin 11.4 (*)    MCV 76.1 (*)    MCH 23.7 (*)    Platelets 452 (*)    All other components within normal limits  URINALYSIS, ROUTINE W REFLEX MICROSCOPIC - Abnormal; Notable for the following components:   APPearance CLOUDY (*)    Glucose, UA >=500 (*)    All other components within normal limits  URINALYSIS, MICROSCOPIC (REFLEX) - Abnormal; Notable for the following components:   Bacteria, UA RARE (*)    All other components within normal limits  RESP PANEL BY RT-PCR (FLU A&B, COVID)  ARPGX2  LIPASE, BLOOD  PREGNANCY, URINE    EKG None  Radiology CT ABDOMEN PELVIS W CONTRAST  Result Date: 07/19/2021 CLINICAL DATA:  Abdominal pain and diarrhea. EXAM: CT ABDOMEN AND PELVIS WITH CONTRAST TECHNIQUE: Multidetector CT imaging of the abdomen and pelvis was performed using the standard protocol following bolus administration of intravenous contrast. CONTRAST:  115m OMNIPAQUE IOHEXOL 300 MG/ML  SOLN COMPARISON:  None. FINDINGS: Lower chest: No acute abnormality. Hepatobiliary: A 7.4 cm x 2.3 cm x 4.8 cm ill-defined area of heterogeneous low attenuation is seen along the anterior aspect of the right and left lobe of the liver. No gallstones, gallbladder  wall thickening, or biliary dilatation. Pancreas: Unremarkable. No pancreatic ductal dilatation or surrounding inflammatory changes. Spleen: Normal in size without focal abnormality. Adrenals/Urinary Tract: Adrenal glands are unremarkable. Kidneys are normal, without renal calculi, focal lesion, or hydronephrosis. There is moderate severity diffuse urinary bladder wall thickening. Stomach/Bowel: Stomach is within normal limits. Appendix appears normal. No evidence of bowel wall thickening, distention, or inflammatory changes. Vascular/Lymphatic: No significant vascular findings are present. Subcentimeter mesenteric lymph nodes are seen within the lower abdomen. Reproductive: Uterus and bilateral adnexa are unremarkable. Other: No abdominal wall hernia or abnormality. No abdominopelvic ascites. Musculoskeletal: No acute or significant osseous findings. IMPRESSION: 1. Ill-defined area of heterogeneous low attenuation along the anterior aspect of the right and left lobe of the liver which may represent a hemangioma. Correlation with nonemergent follow-up MRI abdomen is recommended to exclude the presence of an underlying neoplastic process. 2. Moderate severity diffuse urinary bladder wall thickening which may represent cystitis. Correlation with  urinalysis is recommended. Electronically Signed   By: Virgina Norfolk M.D.   On: 07/19/2021 22:38   US Abdomen Limited RUQ (LIVER/GB)  Result Date: 07/19/2021 CLINICAL DATA:  Right upper quadrant abdominal pain for 5 months with nausea for 1 day. EXAM: ULTRASOUND ABDOMEN LIMITED RIGHT UPPER QUADRANT COMPARISON:  Ultrasound 12/13/2019. FINDINGS: Gallbladder: No gallstones or wall thickening visualized. No sonographic Murphy sign noted by sonographer. Common bile duct: Diameter: 3 mm Liver: There is an ill-defined area of increased echogenicity anteriorly in the left hepatic lobe which likely represents focal fat. This is not clearly seen previously. No suspicious focal liver lesions are identified. Portal vein is patent on color Doppler imaging with normal direction of blood flow towards the liver. Other: None. IMPRESSION: 1. No acute right upper quadrant abdominal findings. No evidence of cholelithiasis or biliary dilatation. 2. Ill-defined increased echogenicity in the left hepatic lobe, probably focal fat. Electronically Signed   By: Richardean Sale M.D.   On: 07/19/2021 20:37    Procedures Procedures   Medications Ordered in ED Medications  dicyclomine (BENTYL) injection 20 mg (20 mg Intramuscular Given 07/19/21 2058)  ondansetron (ZOFRAN-ODT) disintegrating tablet 8 mg (8 mg Oral Given 07/19/21 2058)  alum & mag hydroxide-simeth (MAALOX/MYLANTA) 200-200-20 MG/5ML suspension 30 mL (30 mLs Oral Given 07/19/21 2101)    And  lidocaine (XYLOCAINE) 2 % viscous mouth solution 15 mL (15 mLs Oral Given 07/19/21 2101)  sodium chloride 0.9 % bolus 1,000 mL (1,000 mLs Intravenous New Bag/Given 07/19/21 2150)  HYDROmorphone (DILAUDID) injection 1 mg (1 mg Intravenous Given 07/19/21 2154)  ondansetron (ZOFRAN) injection 4 mg (4 mg Intravenous Given 07/19/21 2150)  iohexol (OMNIPAQUE) 300 MG/ML solution 100 mL (100 mLs Intravenous Contrast Given 07/19/21 2230)    ED Course  I have reviewed the triage  vital signs and the nursing notes.  Pertinent labs & imaging results that were available during my care of the patient were reviewed by me and considered in my medical decision making (see chart for details).    MDM Rules/Calculators/A&P                          Initial impression-presents with right upper quadrant pain, alert, no acute distress, vital signs reassuring.  Unclear etiology of I am concerned for possible biliary abnormality will obtain imaging, lab work and reassess.  Work-up-CBC shows microcytic anemia hemoglobin 11.4, CMP shows sodium of 133, glucose 309, UA unremarkable, negative urine pregnancy, lipase 28.  Ultrasound shows no acute abnormalities,  ill-defined increase echogenicity in the left hepatic lobe.  Reassessment-patient was given Bentyl, Zofran, GI cocktail and will reassess.  Patient is reassessed has nausea and vomiting and still has pain, due to continue upper quadrant tenderness will obtain CT imaging provide fluids, pain medication and reassess.  Patient is reassessed has no complaints, vital signs reassuring, she is resting calmly, tolerating p.o.  She is agreeable for discharge.  She was made aware of the findings seen her CT scan she will follow-up with her PCP.  Rule out-I have low suspicion for systemic infection patient nontoxic-appearing, vital signs reassuring, no leukocytosis present.  Low suspicion for lower lobe pneumonia lung sounds are clear bilaterally, presentation atypical of etiology imaging will be deferred at this time.  Low suspicion for pancreatitis as lipase within normal limits.  Low suspicion for biliary abnormality as she has no elevation liver enzymes, alk phos, T bili, ultrasound negative for acute findings.  Low suspicion for bowel obstruction, intra-abdominal infection, complicated diverticulitis, bowel/stomach perforation CT imaging negative for acute findings.  Low suspicion for UTI [place], kidney stone UA negative for signs infection,  hematuria, no CVA tenderness present my exam.  Imaging does mention shortening around the bladder feel this is an overread without any urinary symptoms and a negative UA will culture urine for further investigation..  Low suspicion for DKA or HHS as blood sugars not super elevated, nontachycardic, CO2 within normal limits, no anion gap present.  Plan-  Right upper quadrant pain-unclear etiology possible this could be secondary to the hypodensity seen in the liver, patient was made aware of these findings, she will follow-up with her PCP for MRI. Nausea vomiting diarrhea-likely patient some from gastroenteritis, recommend bowel rest, provide with Zofran, Bentyl follow-up with PCP.  Vital signs have remained stable, no indication for hospital admission.  Patient given at home care as well strict return precautions.  Patient verbalized that they understood agreed to said plan.  Final Clinical Impression(s) / ED Diagnoses Final diagnoses:  Right upper quadrant abdominal pain  Nausea vomiting and diarrhea    Rx / DC Orders ED Discharge Orders          Ordered    dicyclomine (BENTYL) 20 MG tablet  2 times daily PRN        07/19/21 2309    ondansetron (ZOFRAN) 4 MG tablet  Every 6 hours        07/19/21 2309             Marcello Fennel, PA-C 07/19/21 2312    Malvin Johns, MD 07/20/21 1517

## 2021-07-19 NOTE — ED Triage Notes (Signed)
Abdominal pain and diarrhea x 5 months. Pain is localized to her RUQ.

## 2021-07-19 NOTE — ED Notes (Signed)
Unable to obtain urine sample at this time, pt is aware that urine sample is needed.

## 2021-07-21 LAB — URINE CULTURE: Culture: >=100000 — AB

## 2021-09-19 ENCOUNTER — Other Ambulatory Visit: Payer: Self-pay | Admitting: Gastroenterology

## 2021-09-19 DIAGNOSIS — R1011 Right upper quadrant pain: Secondary | ICD-10-CM

## 2021-09-19 DIAGNOSIS — R932 Abnormal findings on diagnostic imaging of liver and biliary tract: Secondary | ICD-10-CM

## 2021-09-30 ENCOUNTER — Ambulatory Visit: Payer: Medicaid Other

## 2021-10-09 ENCOUNTER — Ambulatory Visit: Admission: RE | Admit: 2021-10-09 | Payer: Medicaid Other | Source: Ambulatory Visit

## 2022-07-20 IMAGING — CT CT ABD-PELV W/ CM
2 of 4 series · 16 of 46 positions shown, 18 images · IV contrast (omnipaque)
Comparison: None.

CLINICAL DATA: Abdominal pain and diarrhea.

EXAM:
CT ABDOMEN AND PELVIS WITH CONTRAST
TECHNIQUE: Multidetector CT imaging of the abdomen and pelvis was performed
using the standard protocol following bolus administration of
intravenous contrast.
CONTRAST:  100mL OMNIPAQUE IOHEXOL 300 MG/ML  SOLN

[Series 2: axial st · axial · 0.96mm/px · z∈[+293,+728]mm · 13 of 95 slices shown, 15 images]
[im 4/95  soft-tissue]
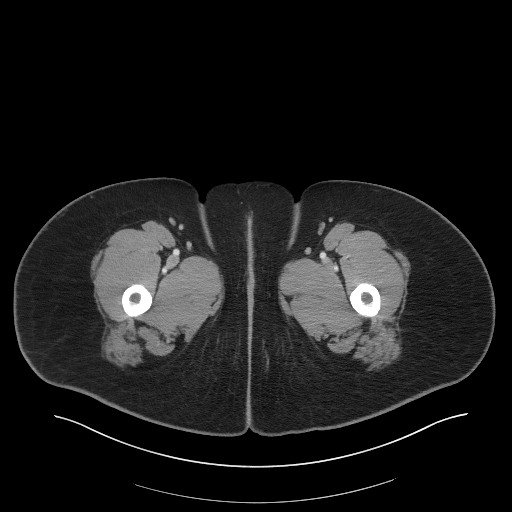
[im 4/95  bone]
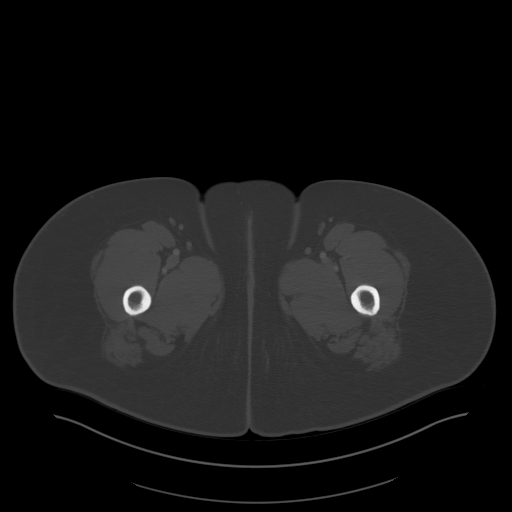
[im 12/95  soft-tissue]
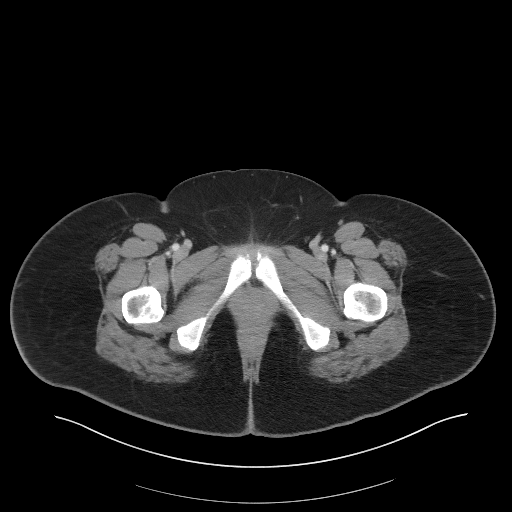
[im 20/95  soft-tissue]
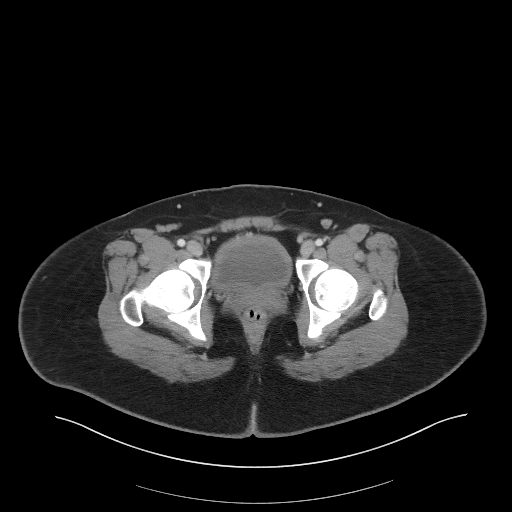
[im 28/95  soft-tissue]
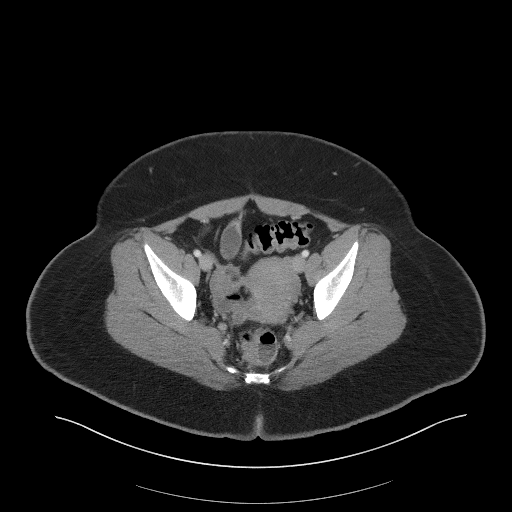
[im 32/95  soft-tissue]
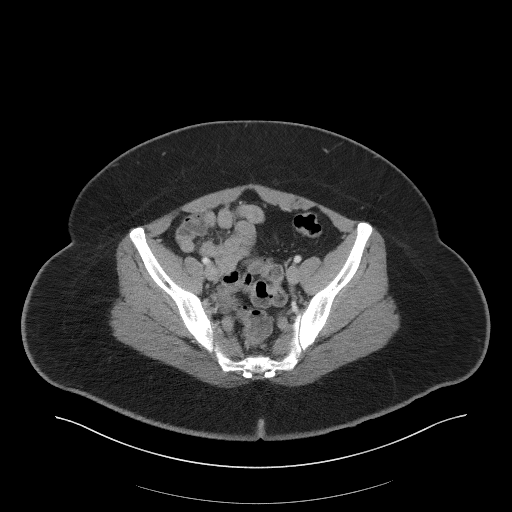
[im 40/95  soft-tissue]
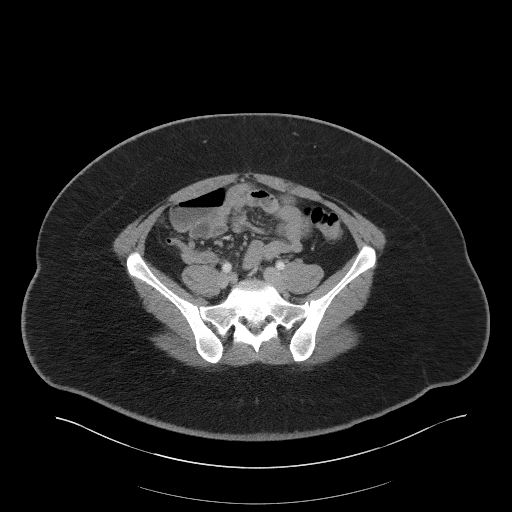
[im 48/95  soft-tissue]
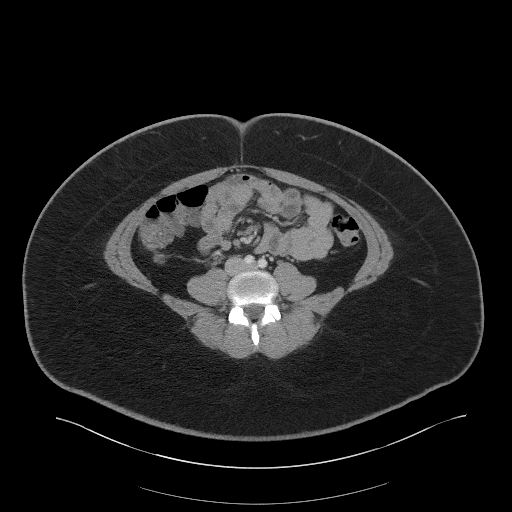
[im 55/95  soft-tissue]
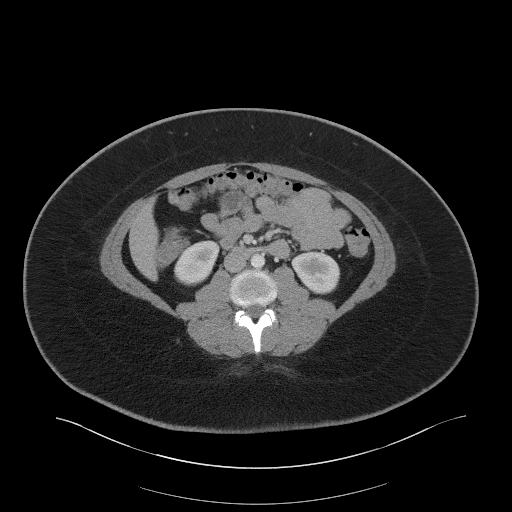
[im 63/95  soft-tissue]
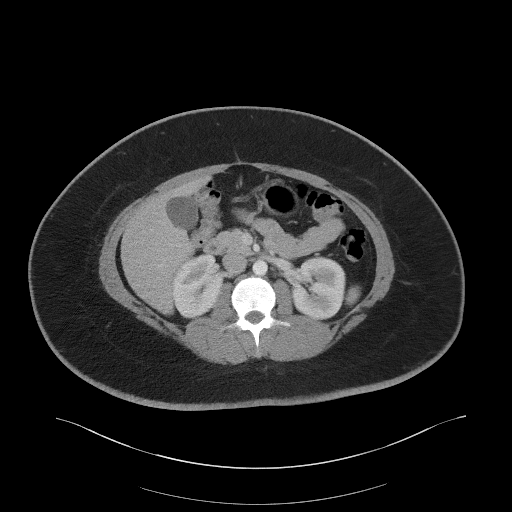
[im 63/95  bone]
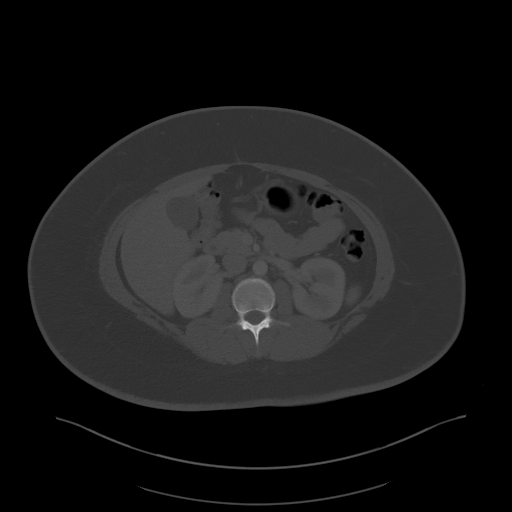
[im 67/95  soft-tissue]
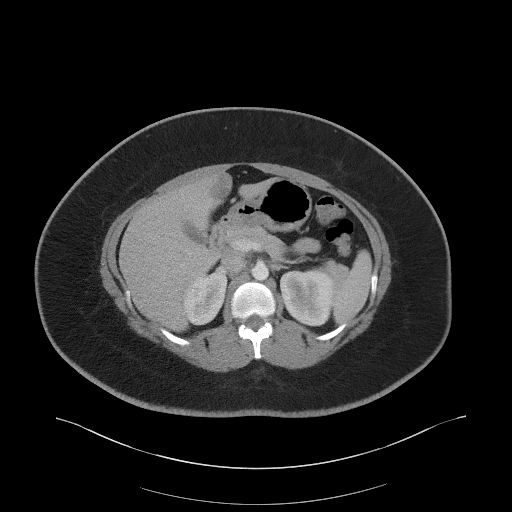
[im 75/95  soft-tissue]
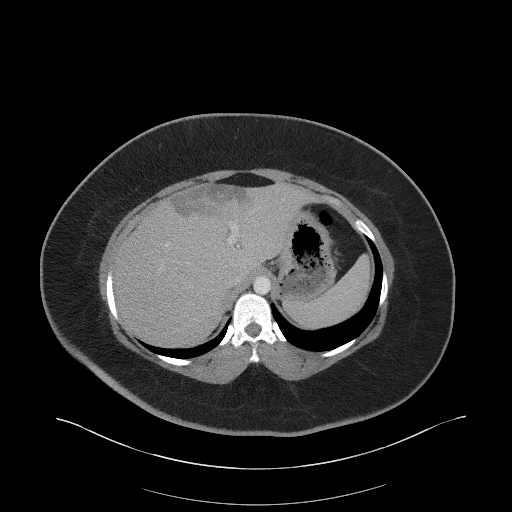
[im 83/95  soft-tissue]
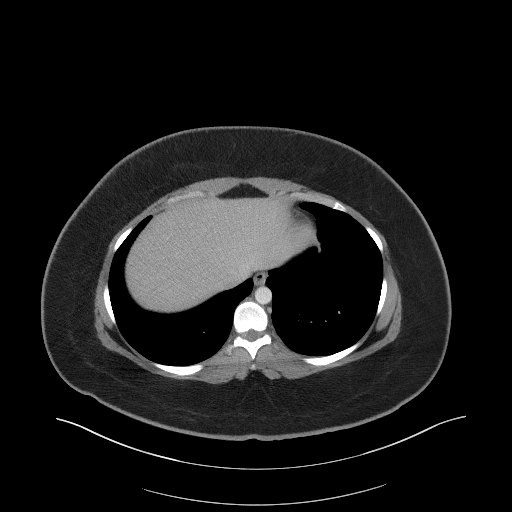
[im 91/95  soft-tissue]
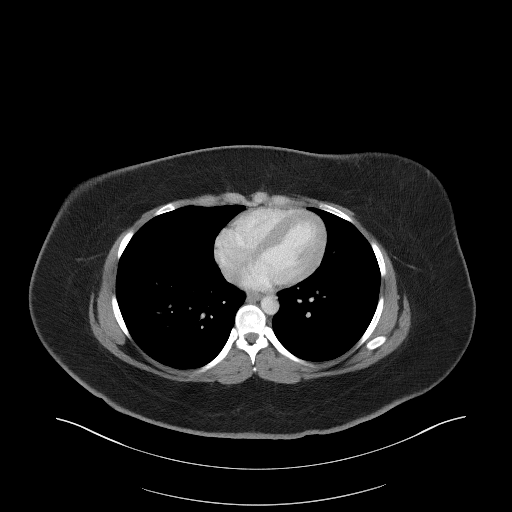

[Series 5: coronal st · coronal · 0.90mm/px · 3 of 115 slices shown]
[im 39/115  soft-tissue]
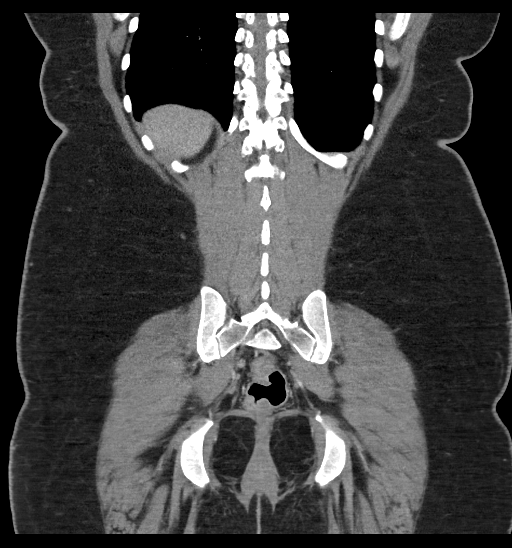
[im 51/115  soft-tissue]
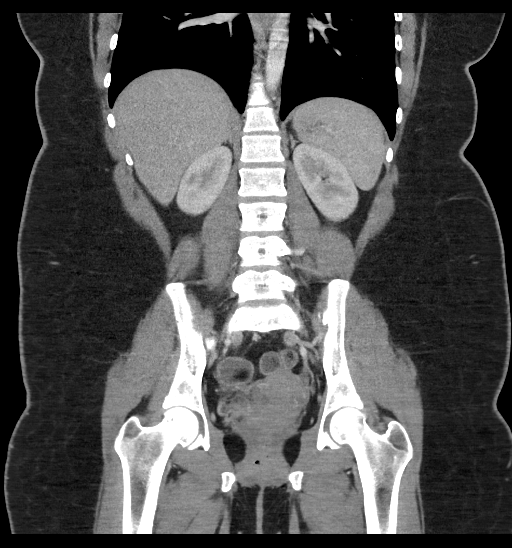
[im 64/115  soft-tissue]
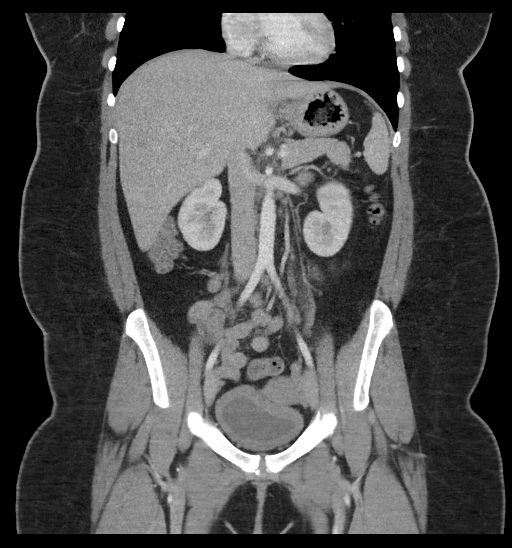

[16 of 46 positions shown; findings below may reference images not displayed]

FINDINGS: Lower chest: No acute abnormality.

Hepatobiliary: A 7.4 cm x 2.3 cm x 4.8 cm ill-defined area of
heterogeneous low attenuation is seen along the anterior aspect of
the right and left lobe of the liver. No gallstones, gallbladder
wall thickening, or biliary dilatation.

Pancreas: Unremarkable. No pancreatic ductal dilatation or
surrounding inflammatory changes.

Spleen: Normal in size without focal abnormality.

Adrenals/Urinary Tract: Adrenal glands are unremarkable. Kidneys are
normal, without renal calculi, focal lesion, or hydronephrosis.
There is moderate severity diffuse urinary bladder wall thickening.

Stomach/Bowel: Stomach is within normal limits. Appendix appears
normal. No evidence of bowel wall thickening, distention, or
inflammatory changes.

Vascular/Lymphatic: No significant vascular findings are present.
Subcentimeter mesenteric lymph nodes are seen within the lower
abdomen.

Reproductive: Uterus and bilateral adnexa are unremarkable.

Other: No abdominal wall hernia or abnormality. No abdominopelvic
ascites.

Musculoskeletal: No acute or significant osseous findings.
IMPRESSION: 1. Ill-defined area of heterogeneous low attenuation along the
anterior aspect of the right and left lobe of the liver which may
represent a hemangioma. Correlation with nonemergent follow-up MRI
abdomen is recommended to exclude the presence of an underlying
neoplastic process.
2. Moderate severity diffuse urinary bladder wall thickening which
may represent cystitis. Correlation with urinalysis is recommended.

## 2022-07-20 IMAGING — US US ABDOMEN LIMITED
1 series · 14 of 25 positions shown · non-contrast
Comparison: Ultrasound 12/13/2019.

CLINICAL DATA: Right upper quadrant abdominal pain for 5 months
with nausea for 1 day.

EXAM:
ULTRASOUND ABDOMEN LIMITED RIGHT UPPER QUADRANT

[Series 1: us abdomen limited · 14 of 79 slices shown]
[im 1/79]
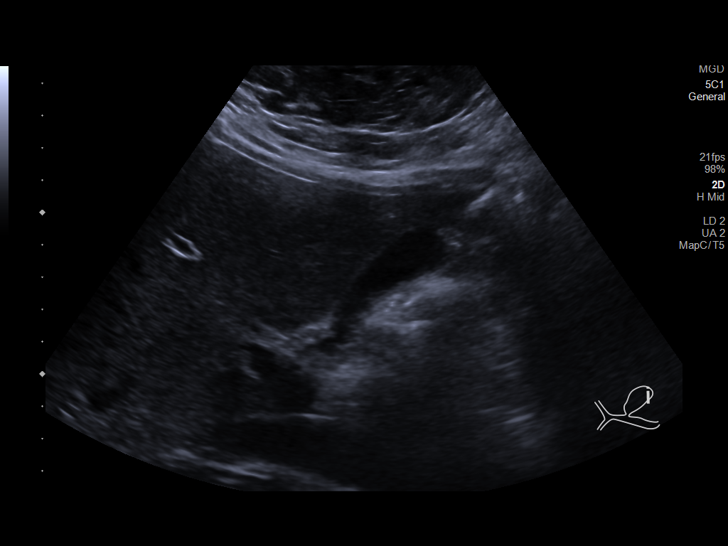
[im 7/79]
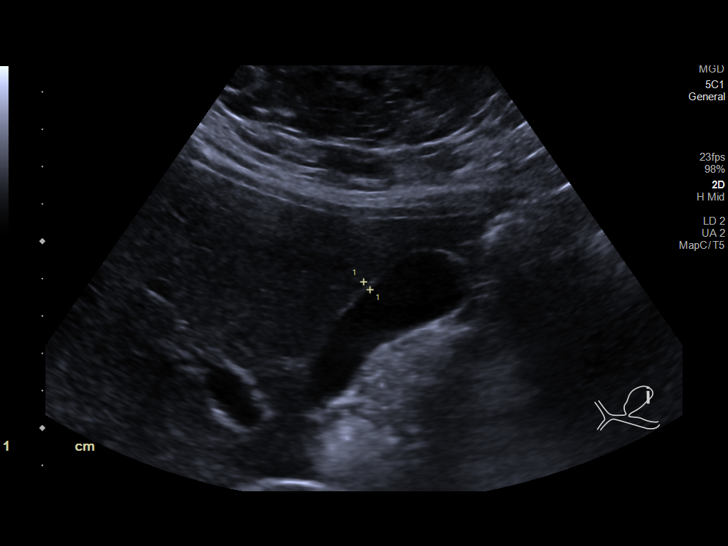
[im 14/79]
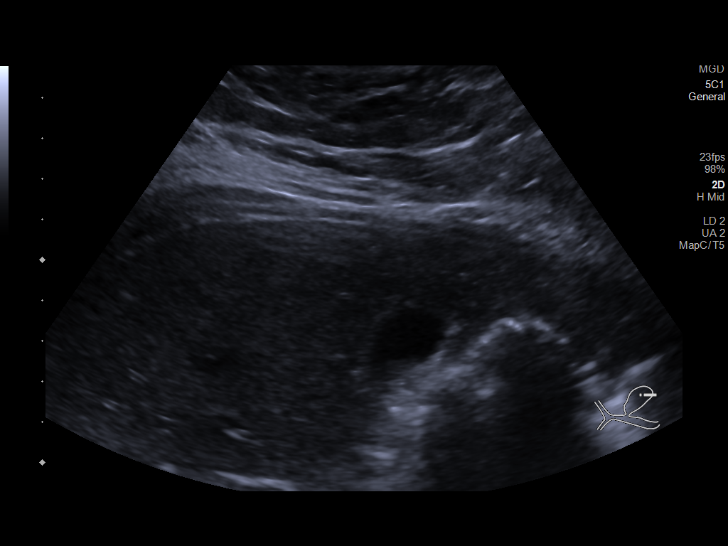
[im 20/79]
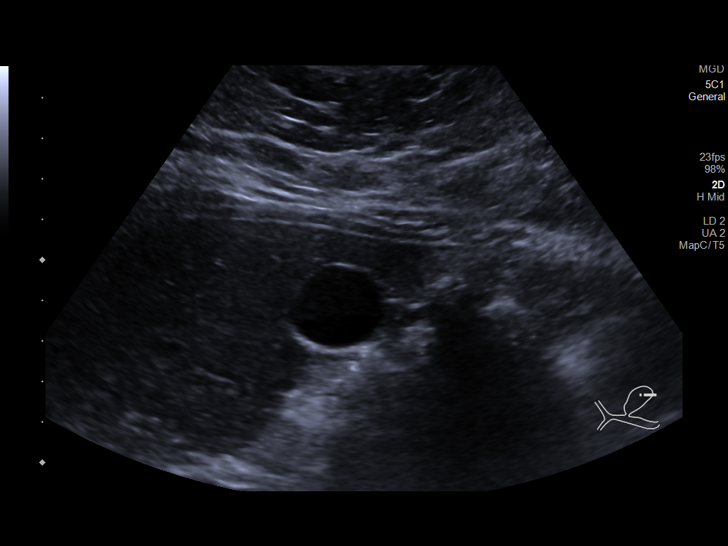
[im 27/79]
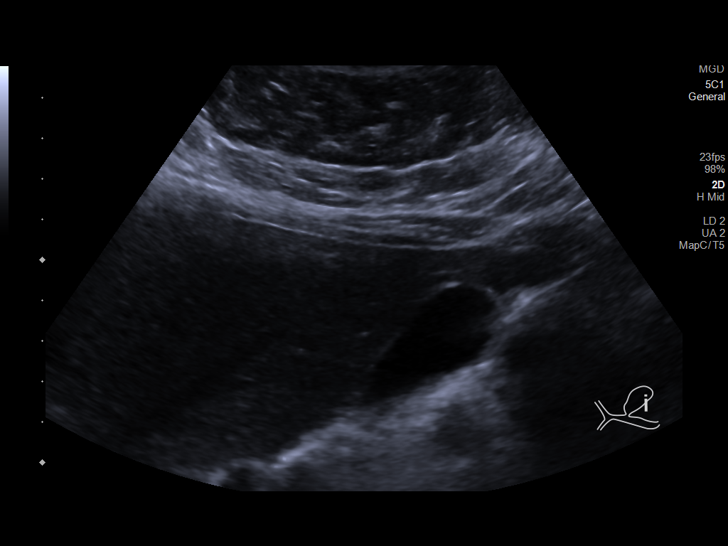
[im 30/79]
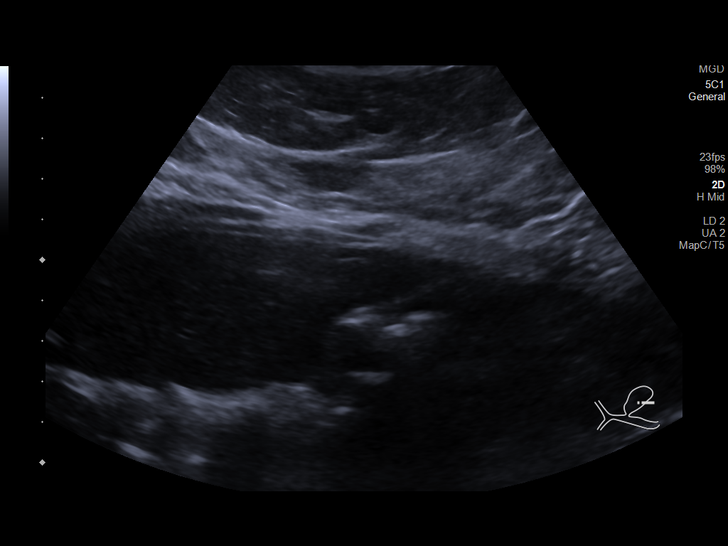
[im 36/79]
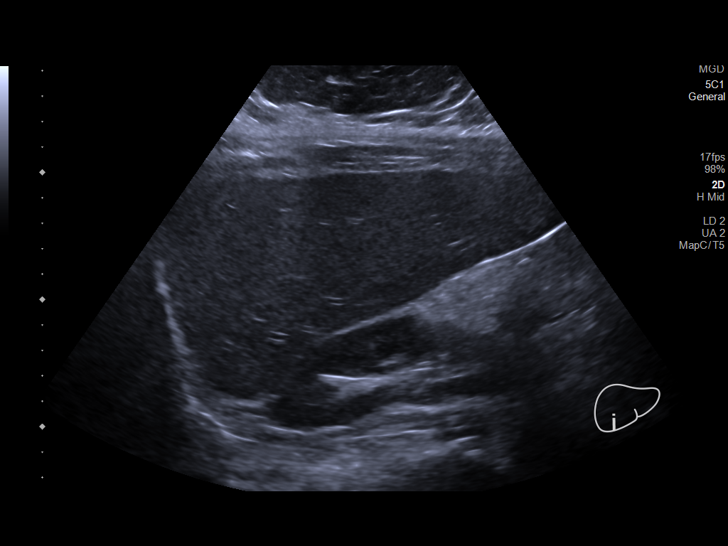
[im 43/79]
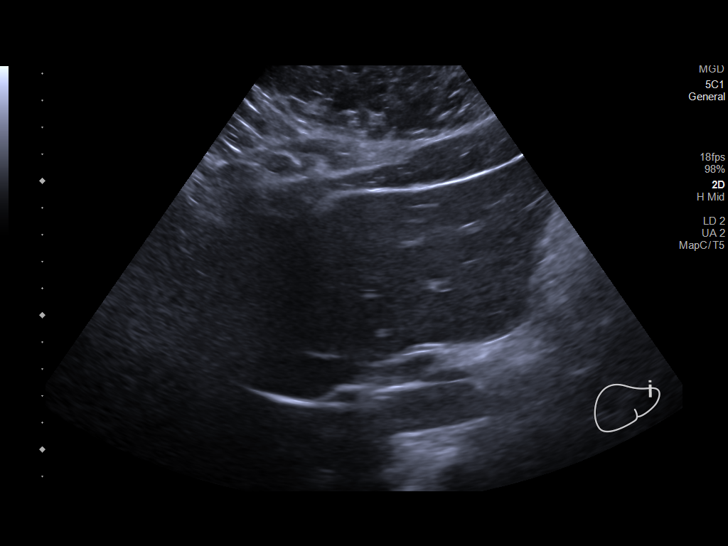
[im 49/79]
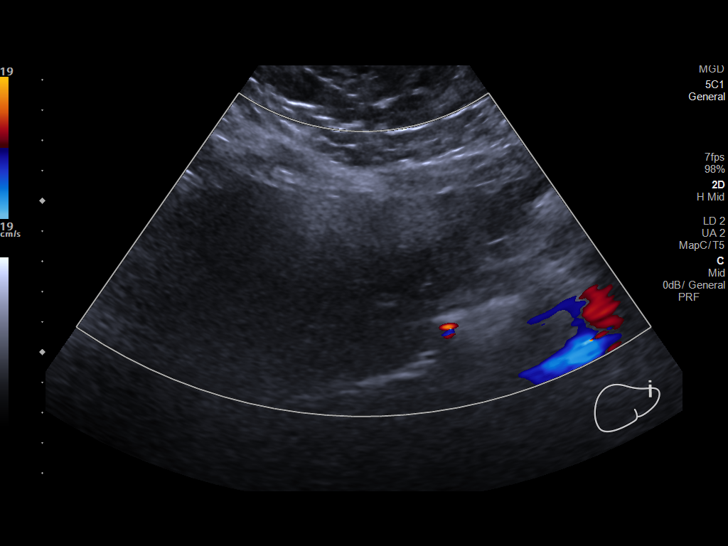
[im 53/79]
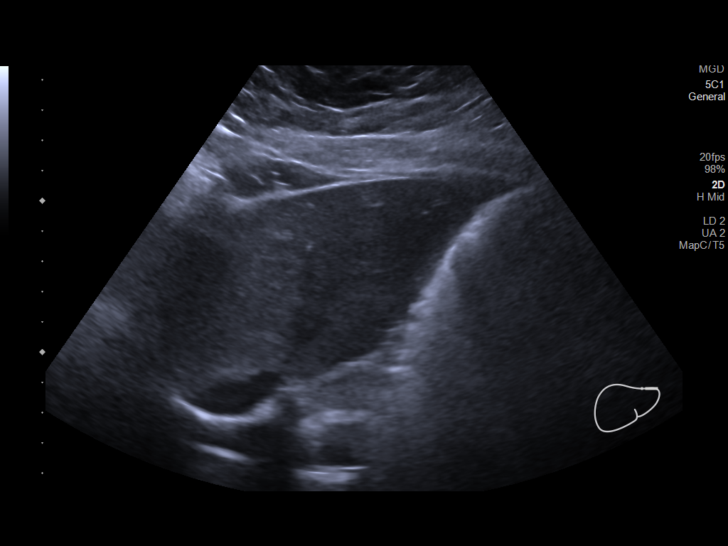
[im 59/79]
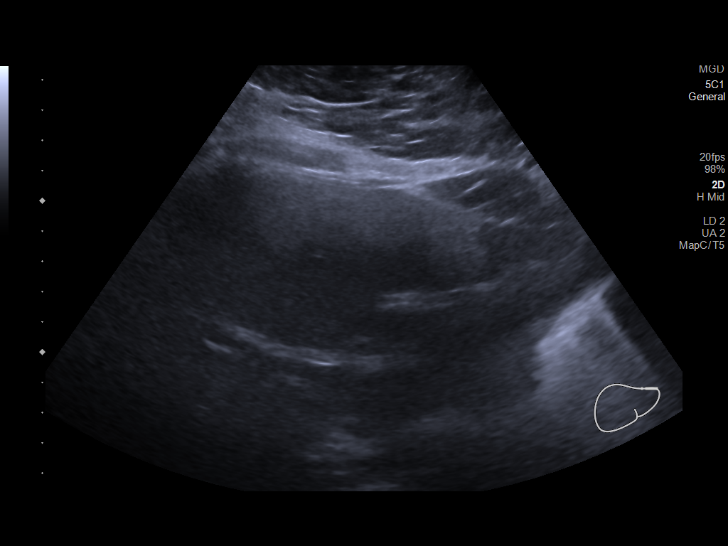
[im 66/79]
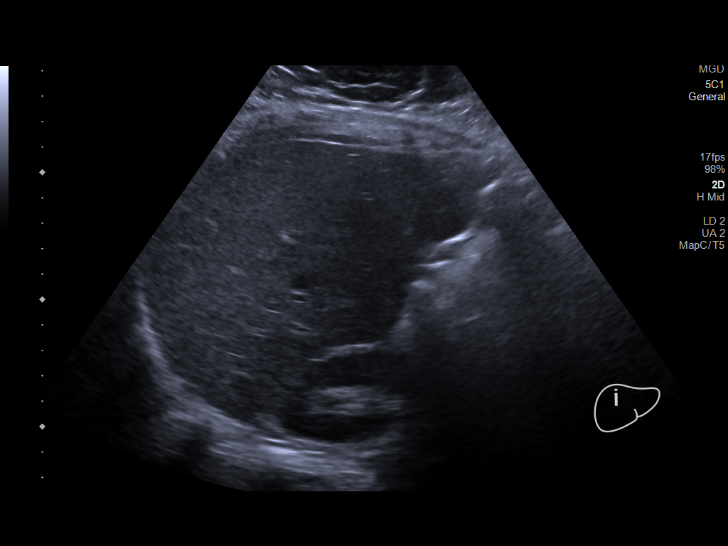
[im 72/79]
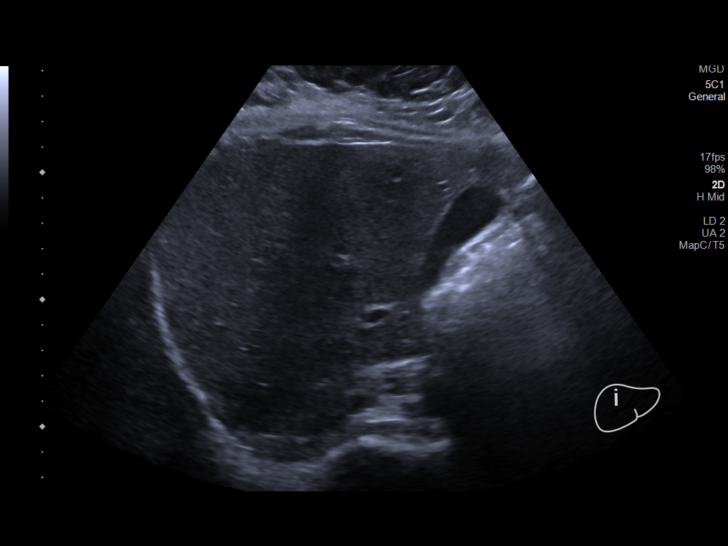
[im 79/79]
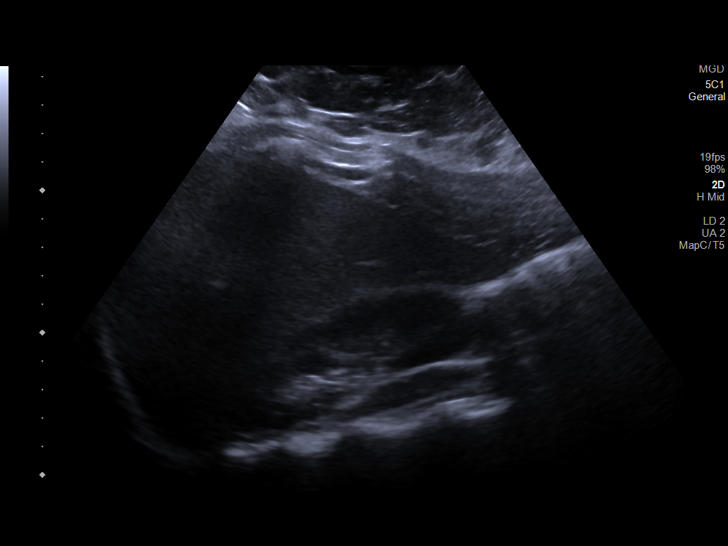

[14 of 25 positions shown; findings below may reference images not displayed]

FINDINGS: Gallbladder:

No gallstones or wall thickening visualized. No sonographic Murphy
sign noted by sonographer.

Common bile duct:

Diameter: 3 mm

Liver:

There is an ill-defined area of increased echogenicity anteriorly in
the left hepatic lobe which likely represents focal fat. This is not
clearly seen previously. No suspicious focal liver lesions are
identified. Portal vein is patent on color Doppler imaging with
normal direction of blood flow towards the liver.

Other: None.
IMPRESSION: 1. No acute right upper quadrant abdominal findings. No evidence of
cholelithiasis or biliary dilatation.
2. Ill-defined increased echogenicity in the left hepatic lobe,
probably focal fat.

## 2022-10-15 ENCOUNTER — Other Ambulatory Visit: Payer: Self-pay | Admitting: Gastroenterology

## 2022-10-15 DIAGNOSIS — K760 Fatty (change of) liver, not elsewhere classified: Secondary | ICD-10-CM

## 2022-10-17 ENCOUNTER — Ambulatory Visit: Payer: Medicaid Other

## 2022-10-24 ENCOUNTER — Ambulatory Visit
Admission: RE | Admit: 2022-10-24 | Discharge: 2022-10-24 | Disposition: A | Discharge status: Home / Self Care | Payer: Medicaid Other | Source: Ambulatory Visit | Attending: Gastroenterology | Admitting: Gastroenterology

## 2022-10-24 DIAGNOSIS — K760 Fatty (change of) liver, not elsewhere classified: Secondary | ICD-10-CM | POA: Insufficient documentation

## 2022-10-27 ENCOUNTER — Other Ambulatory Visit: Payer: Self-pay | Admitting: Gastroenterology

## 2022-10-27 DIAGNOSIS — K760 Fatty (change of) liver, not elsewhere classified: Secondary | ICD-10-CM

## 2022-10-27 DIAGNOSIS — R160 Hepatomegaly, not elsewhere classified: Secondary | ICD-10-CM

## 2022-10-29 ENCOUNTER — Ambulatory Visit
Admission: RE | Admit: 2022-10-29 | Discharge: 2022-10-29 | Disposition: A | Discharge status: Home / Self Care | Payer: Medicaid Other | Source: Ambulatory Visit | Attending: Gastroenterology | Admitting: Gastroenterology

## 2022-10-29 DIAGNOSIS — K760 Fatty (change of) liver, not elsewhere classified: Secondary | ICD-10-CM

## 2022-10-29 DIAGNOSIS — R160 Hepatomegaly, not elsewhere classified: Secondary | ICD-10-CM

## 2022-10-29 MED ORDER — GADOPICLENOL 0.5 MMOL/ML IV SOLN
10.0000 mL | Freq: Once | INTRAVENOUS | Status: AC | PRN
  Administered 2022-10-29: 10 mL via INTRAVENOUS

## 2022-11-13 ENCOUNTER — Encounter (HOSPITAL_COMMUNITY): Payer: Self-pay

## 2022-11-13 ENCOUNTER — Emergency Department (HOSPITAL_COMMUNITY)
Admission: EM | Admit: 2022-11-13 | Discharge: 2022-11-14 | Disposition: A | Discharge status: Home / Self Care | Payer: Medicaid Other | Attending: Emergency Medicine | Admitting: Emergency Medicine

## 2022-11-13 DIAGNOSIS — R1084 Generalized abdominal pain: Secondary | ICD-10-CM | POA: Diagnosis not present

## 2022-11-13 DIAGNOSIS — Z794 Long term (current) use of insulin: Secondary | ICD-10-CM | POA: Diagnosis not present

## 2022-11-13 DIAGNOSIS — R109 Unspecified abdominal pain: Secondary | ICD-10-CM | POA: Diagnosis present

## 2022-11-13 DIAGNOSIS — R112 Nausea with vomiting, unspecified: Secondary | ICD-10-CM | POA: Diagnosis not present

## 2022-11-13 DIAGNOSIS — E119 Type 2 diabetes mellitus without complications: Secondary | ICD-10-CM | POA: Insufficient documentation

## 2022-11-13 DIAGNOSIS — Z7984 Long term (current) use of oral hypoglycemic drugs: Secondary | ICD-10-CM | POA: Diagnosis not present

## 2022-11-13 LAB — COMPREHENSIVE METABOLIC PANEL
ALT: 12 U/L (ref 0–44)
AST: 18 U/L (ref 15–41)
Albumin: 3.6 g/dL (ref 3.5–5.0)
Alkaline Phosphatase: 94 U/L (ref 38–126)
Anion gap: 11 (ref 5–15)
BUN: 11 mg/dL (ref 6–20)
CO2: 23 mmol/L (ref 22–32)
Calcium: 9.4 mg/dL (ref 8.9–10.3)
Chloride: 102 mmol/L (ref 98–111)
Creatinine, Ser: 0.83 mg/dL (ref 0.44–1.00)
GFR, Estimated: >60 mL/min (ref >60)
Glucose, Bld: 271 mg/dL — ABNORMAL HIGH (ref 70–99)
Potassium: 4.2 mmol/L (ref 3.5–5.1)
Sodium: 136 mmol/L (ref 135–145)
Total Bilirubin: 0.3 mg/dL (ref 0.3–1.2)
Total Protein: 7.9 g/dL (ref 6.5–8.1)

## 2022-11-13 LAB — URINALYSIS, ROUTINE W REFLEX MICROSCOPIC
Bacteria, UA: NONE SEEN
Bilirubin Urine: NEGATIVE
Glucose, UA: >=500 mg/dL — AB
Hgb urine dipstick: NEGATIVE
Ketones, ur: 20 mg/dL — AB
Leukocytes,Ua: NEGATIVE
Nitrite: NEGATIVE
Protein, ur: NEGATIVE mg/dL
Specific Gravity, Urine: 1.031 — ABNORMAL HIGH (ref 1.005–1.030)
pH: 5 (ref 5.0–8.0)

## 2022-11-13 LAB — CBC
HCT: 33.6 % — ABNORMAL LOW (ref 36.0–46.0)
Hemoglobin: 10.1 g/dL — ABNORMAL LOW (ref 12.0–15.0)
MCH: 21.8 pg — ABNORMAL LOW (ref 26.0–34.0)
MCHC: 30.1 g/dL (ref 30.0–36.0)
MCV: 72.6 fL — ABNORMAL LOW (ref 80.0–100.0)
Platelets: 535 10*3/uL — ABNORMAL HIGH (ref 150–400)
RBC: 4.63 MIL/uL (ref 3.87–5.11)
RDW: 15.9 % — ABNORMAL HIGH (ref 11.5–15.5)
WBC: 8.7 10*3/uL (ref 4.0–10.5)
nRBC: 0 % (ref 0.0–0.2)

## 2022-11-13 LAB — I-STAT BETA HCG BLOOD, ED (MC, WL, AP ONLY): I-stat hCG, quantitative: <5 m[IU]/mL (ref <5)

## 2022-11-13 LAB — LIPASE, BLOOD: Lipase: 26 U/L (ref 11–51)

## 2022-11-13 MED ORDER — ONDANSETRON HCL 4 MG/2ML IJ SOLN
4.0000 mg | Freq: Once | INTRAMUSCULAR | Status: AC
  Administered 2022-11-14: 4 mg via INTRAVENOUS
  Filled 2022-11-13: qty 2

## 2022-11-13 MED ORDER — HYDROCODONE-ACETAMINOPHEN 5-325 MG PO TABS
1.0000 | ORAL_TABLET | Freq: Once | ORAL | Status: AC
  Administered 2022-11-14: 1 via ORAL
  Filled 2022-11-13: qty 1

## 2022-11-13 NOTE — ED Provider Triage Note (Signed)
Emergency Medicine Provider Triage Evaluation Note  Donna Mcguire , a 22 y.o. female  was evaluated in triage.  Pt complains of upper abdominal pain, nausea since 4 PM today.  Had 1 episode of vomiting in triage.  Denies fever, chest pain, shortness of breath, urinary symptoms..  Endorses constipation and diarrhea.  Review of Systems  Positive: As above Negative: As above  Physical Exam  BP (!) 145/91 (BP Location: Left Arm)   Pulse 86   Temp 98.5 F (36.9 C) (Oral)   Resp 18   SpO2 100%  Gen:   Awake, no distress   Resp:  Normal effort  MSK:   Moves extremities without difficulty  Other:    Medical Decision Making  Medically screening exam initiated at 10:23 PM.  Appropriate orders placed.  Donna Mcguire was informed that the remainder of the evaluation will be completed by another provider, this initial triage assessment does not replace that evaluation, and the importance of remaining in the ED until their evaluation is complete.    Donna Mcguire, [place] 11/13/22 2224

## 2022-11-13 NOTE — ED Provider Notes (Incomplete)
[facility] Provider Note   CSN: [id number] Arrival date & time: 11/13/22  2120     History  Chief Complaint  Patient presents with  . Abdominal Pain    Donna Mcguire is a 22 y.o. female with medical history of diabetes, fatty liver disease.  Patient presents to ED for evaluation of abdominal pain.  Patient reports that around 5 PM this afternoon she developed abdominal pain located primarily in the right side of her abdomen, described as a burning sensation.  Patient reports that lying on her side to alleviate the pain, lying on her back will aggravate the pain.  Patient endorsing 1 episode of nausea and vomiting.  Patient denies fevers, diarrhea, dysuria, chest pain, shortness of breath, body aches or chills.  Patient reports history of fatty liver disease, reports that she mildly drinks now.  Patient states that typically in the past she will have abdominal pain like this around her period.  Patient reports that her last menstrual cycle was 3 weeks ago, denies that she is due to start her menstrual cycle anytime soon.   Abdominal Pain      Home Medications Prior to Admission medications   Medication Sig Start Date End Date Taking? Authorizing Provider  dicyclomine (BENTYL) 20 MG tablet Take 1 tablet (20 mg total) by mouth 2 (two) times daily as needed for spasms. 07/19/21   Marcello Fennel, PA-C  glucagon 1 MG injection Inject 1 mg into the muscle as needed. 11/05/15   [provider]  insulin aspart (NOVOLOG FLEXPEN) 100 UNIT/ML FlexPen Inject 20 Units into the skin 3 (three) times daily before meals. Patient taking 20 units before breakfast and lunch, 30 units before supper 02/26/18   [provider]  Insulin Glargine (LANTUS SOLOSTAR) 100 UNIT/ML Solostar Pen Inject 40 Units into the skin at bedtime.    [provider]  meloxicam (MOBIC) 15 MG tablet Take 1 tablet (15 mg total) by mouth daily. 04/02/21    Isla Pence, MD  metFORMIN (GLUCOPHAGE-XR) 500 MG 24 hr tablet Take by mouth. 04/10/21 04/10/22  [provider]  methocarbamol (ROBAXIN) 500 MG tablet Take 1 tablet (500 mg total) by mouth 2 (two) times daily. 04/02/21   Isla Pence, MD  ondansetron (ZOFRAN) 4 MG tablet Take 1 tablet (4 mg total) by mouth every 6 (six) hours. 07/19/21   Marcello Fennel, PA-C  Vitamin D, Ergocalciferol, (DRISDOL) 1.25 MG (50000 UT) CAPS capsule Take 50,000 Units by mouth once a week. 01/27/18   [provider]      Allergies    Patient has no known allergies.    Review of Systems   Review of Systems  Gastrointestinal:  Positive for abdominal pain.    Physical Exam Updated Vital Signs BP (!) 145/91 (BP Location: Left Arm)   Pulse 86   Temp 98.5 F (36.9 C) (Oral)   Resp 18   SpO2 100%  Physical Exam  ED Results / Procedures / Treatments   Labs (all labs ordered are listed, but only abnormal results are displayed) Labs Reviewed  COMPREHENSIVE METABOLIC PANEL - Abnormal; Notable for the following components:      Result Value   Glucose, Bld 271 (*)    All other components within normal limits  CBC - Abnormal; Notable for the following components:   Hemoglobin 10.1 (*)    HCT 33.6 (*)    MCV 72.6 (*)    MCH 21.8 (*)  RDW 15.9 (*)    Platelets 535 (*)    All other components within normal limits  LIPASE, BLOOD  URINALYSIS, ROUTINE W REFLEX MICROSCOPIC  I-STAT BETA HCG BLOOD, ED ([facility], [facility], [facility] ONLY)  CBG MONITORING, ED    EKG None  Radiology No results found.  Procedures Procedures  {Document cardiac monitor, telemetry assessment procedure when appropriate:1}  Medications Ordered in ED Medications - No data to display  ED Course/ Medical Decision Making/ A&P   {   Click here for ABCD2, HEART and other calculatorsREFRESH Note before signing :1}                          Medical Decision Making Amount and/or Complexity of Data Reviewed Labs:  ordered.   ***  {Document critical care time when appropriate:1} {Document review of labs and clinical decision tools ie heart score, Chads2Vasc2 etc:1}  {Document your independent review of radiology images, and any outside records:1} {Document your discussion with family members, caretakers, and with consultants:1} {Document social determinants of health affecting pt's care:1} {Document your decision making why or why not admission, treatments were needed:1} Final Clinical Impression(s) / ED Diagnoses Final diagnoses:  None    Rx / DC Orders ED Discharge Orders     None

## 2022-11-13 NOTE — ED Provider Notes (Addendum)
[facility] Provider Note   CSN: [id number] Arrival date & time: 11/13/22  2120     History  Chief Complaint  Patient presents with   Abdominal Pain    Donna Mcguire is a 22 y.o. female with medical history of diabetes, fatty liver disease.  Patient presents to ED for evaluation of abdominal pain.  Patient reports that around 5 PM this afternoon she developed abdominal pain located primarily in the right side of her abdomen, described as a burning sensation.  Patient reports that lying on her side will alleviate the pain, lying on her back will aggravate the pain.  Patient endorsing 1 episode of nausea and vomiting.  Patient denies fevers, diarrhea, dysuria, chest pain, shortness of breath, body aches or chills.  Patient denies vaginal discharge.  Patient reports last bowel movement this morning.  Patient reports history of fatty liver disease, reports that she mildly drinks now.  Patient states that typically in the past she will have abdominal pain like this around the time of her period.  Patient reports that her last menstrual cycle was 3 weeks ago, denies that she is due to start her menstrual cycle anytime soon.   Abdominal Pain      Home Medications Prior to Admission medications   Medication Sig Start Date End Date Taking? Authorizing Provider  ondansetron (ZOFRAN) 4 MG tablet Take 1 tablet (4 mg total) by mouth every 6 (six) hours as needed for nausea or vomiting. 11/14/22  Yes Azucena Cecil, PA-C  dicyclomine (BENTYL) 20 MG tablet Take 1 tablet (20 mg total) by mouth 2 (two) times daily as needed for spasms. 07/19/21   Marcello Fennel, PA-C  glucagon 1 MG injection Inject 1 mg into the muscle as needed. 11/05/15   [provider]  insulin aspart (NOVOLOG FLEXPEN) 100 UNIT/ML FlexPen Inject 20 Units into the skin 3 (three) times daily before meals. Patient taking 20 units before breakfast and lunch, 30 units before  supper 02/26/18   [provider]  Insulin Glargine (LANTUS SOLOSTAR) 100 UNIT/ML Solostar Pen Inject 40 Units into the skin at bedtime.    [provider]  meloxicam (MOBIC) 15 MG tablet Take 1 tablet (15 mg total) by mouth daily. 04/02/21   Isla Pence, MD  metFORMIN (GLUCOPHAGE-XR) 500 MG 24 hr tablet Take by mouth. 04/10/21 04/10/22  [provider]  methocarbamol (ROBAXIN) 500 MG tablet Take 1 tablet (500 mg total) by mouth 2 (two) times daily. 04/02/21   Isla Pence, MD  Vitamin D, Ergocalciferol, (DRISDOL) 1.25 MG (50000 UT) CAPS capsule Take 50,000 Units by mouth once a week. 01/27/18   [provider]      Allergies    Patient has no known allergies.    Review of Systems   Review of Systems  Gastrointestinal:  Positive for abdominal pain.  All other systems reviewed and are negative.   Physical Exam Updated Vital Signs BP (!) 145/91 (BP Location: Left Arm)   Pulse 78   Temp 98.5 F (36.9 C) (Oral)   Resp 18   SpO2 98%  Physical Exam Vitals and nursing note reviewed.  Constitutional:      General: She is not in acute distress.    Appearance: She is not toxic-appearing.  HENT:     Head: Normocephalic and atraumatic.     Nose: Nose normal. No congestion.     Mouth/Throat:     Mouth: Mucous membranes are moist.  Pharynx: Oropharynx is clear.  Eyes:     Extraocular Movements: Extraocular movements intact.     Conjunctiva/sclera: Conjunctivae normal.     Pupils: Pupils are equal, round, and reactive to light.  Cardiovascular:     Rate and Rhythm: Normal rate and regular rhythm.  Pulmonary:     Effort: Pulmonary effort is normal.     Breath sounds: Normal breath sounds. No wheezing.  Abdominal:     General: Abdomen is flat. Bowel sounds are normal.     Palpations: Abdomen is soft.     Tenderness: There is generalized abdominal tenderness. There is no right CVA tenderness or left CVA tenderness.  Musculoskeletal:     Cervical  back: Normal range of motion and neck supple. No tenderness.  Skin:    General: Skin is warm and dry.     Capillary Refill: Capillary refill takes less than 2 seconds.  Neurological:     Mental Status: She is alert and oriented to person, place, and time.     ED Results / Procedures / Treatments   Labs (all labs ordered are listed, but only abnormal results are displayed) Labs Reviewed  COMPREHENSIVE METABOLIC PANEL - Abnormal; Notable for the following components:      Result Value   Glucose, Bld 271 (*)    All other components within normal limits  CBC - Abnormal; Notable for the following components:   Hemoglobin 10.1 (*)    HCT 33.6 (*)    MCV 72.6 (*)    MCH 21.8 (*)    RDW 15.9 (*)    Platelets 535 (*)    All other components within normal limits  URINALYSIS, ROUTINE W REFLEX MICROSCOPIC - Abnormal; Notable for the following components:   APPearance HAZY (*)    Specific Gravity, Urine 1.031 (*)    Glucose, UA >=500 (*)    Ketones, ur 20 (*)    All other components within normal limits  LIPASE, BLOOD  I-STAT BETA HCG BLOOD, ED ([facility], [facility], [facility] ONLY)  CBG MONITORING, ED    EKG None  Radiology CT ABDOMEN PELVIS W CONTRAST  Result Date: 11/14/2022 CLINICAL DATA:  Right side abdominal pain EXAM: CT ABDOMEN AND PELVIS WITH CONTRAST TECHNIQUE: Multidetector CT imaging of the abdomen and pelvis was performed using the standard protocol following bolus administration of intravenous contrast. RADIATION DOSE REDUCTION: This exam was performed according to the departmental dose-optimization program which includes automated exposure control, adjustment of the mA and/or kV according to patient size and/or use of iterative reconstruction technique. CONTRAST:  155mL OMNIPAQUE IOHEXOL 300 MG/ML  SOLN COMPARISON:  07/19/2021 FINDINGS: Lower chest: No acute findings Hepatobiliary: Low-density anteriorly in the liver adjacent to the falciform ligament. This is compatible with focal fatty  infiltration. No suspicious focal hepatic abnormality. Gallbladder unremarkable. Pancreas: No focal abnormality or ductal dilatation. Spleen: No focal abnormality.  Normal size. Adrenals/Urinary Tract: No adrenal abnormality. No focal renal abnormality. No stones or hydronephrosis. Urinary bladder wall mildly thickened, similar to prior study. Stomach/Bowel: Appendix is normal. Stomach, large and small bowel grossly unremarkable. Vascular/Lymphatic: No evidence of aneurysm or adenopathy. Reproductive: Uterus and adnexa unremarkable.  No mass. Other: No free fluid or free air. Musculoskeletal: No acute bony abnormality. IMPRESSION: Mild urinary bladder wall thickening. This could reflect cystitis. This is stable since prior study. Normal appendix. Electronically Signed   By: Rolm Baptise M.D.   On: 11/14/2022 01:03    Procedures Procedures   Medications Ordered in ED Medications  ondansetron (  ZOFRAN) injection 4 mg (4 mg Intravenous Given 11/14/22 0002)  HYDROcodone-acetaminophen (NORCO/VICODIN) 5-325 MG per tablet 1 tablet (1 tablet Oral Given 11/14/22 0002)  iohexol (OMNIPAQUE) 300 MG/ML solution 100 mL (100 mLs Intravenous Contrast Given 11/14/22 0048)  sodium chloride (PF) 0.9 % injection (  Given by Other 11/14/22 0104)    ED Course/ Medical Decision Making/ A&P  Medical Decision Making Amount and/or Complexity of Data Reviewed Labs: ordered. Radiology: ordered.  Risk Prescription drug management.   22 year old female presents to the ED for evaluation.  Please see HPI for further details.  On examination the patient is afebrile and nontachycardic.  Lung sounds are bilaterally, she is not hypoxic.  Abdomen is soft and compressible however the patient does have tenderness generally throughout her entire abdomen that is nonfocal in nature.  The patient has no CVA tenderness bilaterally.  The patient neurological examination is at baseline.  Patient CBC with no leukocytosis, hemoglobin of  10.1 which seems to patient baseline on chart review.  The patient denies any vaginal bleeding or blood loss.  Patient CMP with elevated glucose of 271 however no anion gap, no electrolyte derangement.  Urinalysis shows increased Pacific gravity, high glucose, ketones.  Patient CT scan of abdomen and pelvis shows urinary wall thickening however this is unchanged from previous studies.  The patient denies dysuria, the patient urinalysis does not indicate infection.  Patient given Zofran, hydrocodone and she states that her nausea has dissipated, her abdominal pain is also significantly reduced.  At this time, the patient be discharged and advised to follow-up with her PCP.  Patient given return precautions and she voiced understanding.  Patient advised that her sugar is elevated here tonight however she reports that she has been unable to take her metformin today secondary to her nausea.  Patient case discussed with attending Dr. Ralene Bathe who voices agreement with plan of management.   Final Clinical Impression(s) / ED Diagnoses Final diagnoses:  Generalized abdominal pain    Rx / DC Orders ED Discharge Orders          Ordered    ondansetron (ZOFRAN) 4 MG tablet  Every 6 hours PRN        11/14/22 0211                Azucena Cecil, PA-C 11/14/22 [id number]    Quintella Reichert, MD 11/14/22 [phone]

## 2022-11-13 NOTE — ED Triage Notes (Signed)
Pt states that she has been having RUQ abd pain with nausea and diarrhea. Pt has hx of non alcoholic fatty liver

## 2022-11-14 ENCOUNTER — Emergency Department (HOSPITAL_COMMUNITY): Payer: Medicaid Other

## 2022-11-14 ENCOUNTER — Encounter (HOSPITAL_COMMUNITY): Payer: Self-pay

## 2022-11-14 MED ORDER — IOHEXOL 300 MG/ML  SOLN
100.0000 mL | Freq: Once | INTRAMUSCULAR | Status: AC | PRN
  Administered 2022-11-14: 100 mL via INTRAVENOUS

## 2022-11-14 MED ORDER — ONDANSETRON HCL 4 MG PO TABS: 4.0000 mg | ORAL_TABLET | Freq: Four times a day (QID) | ORAL | 0 refills | Status: AC | PRN

## 2022-11-14 MED ORDER — SODIUM CHLORIDE (PF) 0.9 % IJ SOLN
INTRAMUSCULAR | Status: AC
  Filled 2022-11-14: qty 50

## 2022-11-14 NOTE — Discharge Instructions (Signed)
Please return to the ED with any new or worsening signs or symptoms such as fevers, increased abdominal pain Please follow-up with your PCP for further management. Please begin taking Zofran every 6 hours as needed for nausea and vomiting.   Please continue taking metformin as directed Please see school note

## 2022-12-10 ENCOUNTER — Encounter: Payer: Self-pay | Admitting: Dietician

## 2022-12-10 ENCOUNTER — Encounter: Payer: Medicaid Other | Attending: Gastroenterology | Admitting: Dietician

## 2022-12-10 DIAGNOSIS — K760 Fatty (change of) liver, not elsewhere classified: Secondary | ICD-10-CM | POA: Diagnosis not present

## 2022-12-10 DIAGNOSIS — E639 Nutritional deficiency, unspecified: Secondary | ICD-10-CM | POA: Insufficient documentation

## 2022-12-10 DIAGNOSIS — Z6841 Body Mass Index (BMI) 40.0 and over, adult: Secondary | ICD-10-CM | POA: Diagnosis not present

## 2022-12-10 DIAGNOSIS — Z713 Dietary counseling and surveillance: Secondary | ICD-10-CM | POA: Diagnosis present

## 2022-12-10 NOTE — Progress Notes (Signed)
Medical Nutrition Therapy  Appointment Start time:  [phone]  Appointment End time:  1510  Primary concerns today: Pt is concerned about her diagnosis of NAFLD.    Referral diagnosis: fatty liver Preferred learning style: no preference indicated Learning readiness: ready   NUTRITION ASSESSMENT   Anthropometrics  Weight: not assessed  Clinical Medical Hx: type 2 diabetes, NAFLD, vitamin D deficiency Medications: insulin aspart (novolog), insulin glargine (lantus), metformin, glucagon Labs: A1c 7.4 (per pt) Notable Signs/Symptoms: nausea/diarrhea  Food Allergies: tomatoes (stomach pain)  Lifestyle & Dietary Hx  Pt is present today with her partner Jonny Ruiz.   Pt lives with her family and she states they cook most meals and do not include a lot of vegetables in their meals. Pt states they cook with mostly saturated fats.   Pt states she feels that she has high stress. Pt states she has a tendency to stress eat. Pt states she feels like she binge eats occasionally.   Pt states she gets diarrhea sometimes and occasionally feels nauseated and throws up. She states that spicy food irritates it more. She states this happens every couple of months.   Pt states she used to drink a lot of sodas but reduced it about a year ago.   Pt state she has has had type 2 diabetes since she was 22 years old and has been on insulin since but only recently started metformin.   Pt reports she just had surgery on her ankle and is in physical therapy for a few more months and goes 2x/wk for 30 minutes.   Pt enjoys swimming. She sates she has a pool at the apartment complex and wants to start utilizing it.   Estimated daily fluid intake: 16-64 oz Supplements: vitamin D Sleep: 4-10 hours Stress / self-care: moderate-to-high, enjoys crochet Current average weekly physical activity: ADLs  24-Hr Dietary Recall First Meal: none Snack: none Second Meal: crackers OR toast and jam OR bagel  Snack: oatmeal cream  pie OR chocolate Snack: hot cheetos Third Meal: meat with rice and beans and vegetable Snack: none Beverages: water, occasionally vitamin water   NUTRITION DIAGNOSIS  NB-1.1 Food and nutrition-related knowledge deficit As related to lack of prior nutrition education by a nutrition professional.  As evidenced by pt report.   NUTRITION INTERVENTION  Nutrition education (E-1) on the following topics:  Building balanced meals and snacks MyPlate Impact of adequate hydration on blood glucose Saturated vs unsaturated fats Omega 3's sources Importance of consistent meal times Strategies to include more non-starchy vegetables in the diet Impact of soluble fiber on cholesterol Impact of frequent snacking on blood glucose Benefits of physical activity and goals  Handouts Provided Include  MyPlate Meal Ideas  Learning Style & Readiness for Change Teaching method utilized: Visual & Auditory  Demonstrated degree of understanding via: Teach Back  Barriers to learning/adherence to lifestyle change: none  Goals Established by Pt Goal 1: eat something for breakfast within 1-2 hours of waking up (carb + protein). Goal 2: aim to include a non-starchy vegetable with lunch and dinner.  Aim for 150 minutes of physical activity weekly: recommend PT exercises at home and chair workout on Youtube.   Aim for 64 oz water daily.  Aim to switch saturated fat sources to unsaturated and omega 3's.   MONITORING & EVALUATION Dietary intake, weekly physical activity, and follow up in 2 months.  Next Steps  Patient is to call for questions.

## 2023-02-05 ENCOUNTER — Encounter: Payer: Medicaid Other | Attending: Gastroenterology | Admitting: Dietician

## 2023-02-05 ENCOUNTER — Encounter: Payer: Self-pay | Admitting: Dietician

## 2023-02-05 DIAGNOSIS — K760 Fatty (change of) liver, not elsewhere classified: Secondary | ICD-10-CM | POA: Diagnosis present

## 2023-02-05 NOTE — Patient Instructions (Signed)
Previous Goals: Eat something for breakfast within 1-2 hours of waking up (carb + protein). - in progress, continue.  Aim to include a non-starchy vegetable with lunch and dinner. - in progress, continue.   Aim for 150 minutes of physical activity weekly: recommend PT exercises at home and chair workout on Youtube.- in progress, continue.   Aim for 64 oz water daily. - in progress, continue.   Aim to switch saturated fat sources to unsaturated and omega 3's. - in progress, continue.   New Goals:  Eat 3 times per day (at least 2 meals and a snack, or 3 meals per day)

## 2023-02-05 NOTE — Progress Notes (Signed)
Medical Nutrition Therapy  Appointment Start time:  1400  Appointment End time:  1430  Primary concerns today: Pt is concerned about her diagnosis of NAFLD.    Referral diagnosis: fatty liver Preferred learning style: no preference indicated Learning readiness: ready   NUTRITION ASSESSMENT   Anthropometrics  Weight: not assessed  Clinical Medical Hx: type 2 diabetes, NAFLD, vitamin D deficiency Medications: insulin aspart (novolog), insulin glargine (lantus), metformin, glucagon Labs: A1c 7.4 (per pt) Notable Signs/Symptoms: nausea/diarrhea  Food Allergies: tomatoes (stomach pain)  Lifestyle & Dietary Hx  Pt states she lost 16 lb since the last visit. She states this was unintentional but she thinks it is due to her eating habits improving and she has been walking more.   Pt states she has been eating more consistently and trying to eat something in the morning.   Pt reports she has been walking more and has 1 1/2 months left of physical therapy for her foot.   Pt states she's been eating more greens and has been including a vegetable with dinner daily and has been trying to have fish once per week.   Pt states her water intake has increased.   Estimated daily fluid intake: 64 oz Supplements: vitamin D Sleep: 4-10 hours Stress / self-care: moderate-to-high, enjoys crochet Current average weekly physical activity: physical therapy and some walking  24-Hr Dietary Recall First Meal: sandwich OR eggs and toast Snack: none Second Meal: (if wakes up late skips) OR sandwich  Snack: none OR crackers OR chips Third Meal: protein with rice and vegetable OR saag with yogurt and squash Snack: none Beverages: water, soda or juice 1x/wk.    NUTRITION DIAGNOSIS  NB-1.1 Food and nutrition-related knowledge deficit As related to lack of prior nutrition education by a nutrition professional.  As evidenced by pt report.   NUTRITION INTERVENTION  Nutrition education (E-1) on the  following topics:  Building balanced meals and snacks while following the plate method and working toward more consistent meal times Strategies to include more non-starchy vegetables in the diet Choose Healthy Unsaturated Fats and Omega 3's. Sources: Olive oil, canola oil, avocados, nuts, and Fatty fish (salmon, trout), walnuts, flaxseeds, and chia seeds. Benefits: Helps raise HDL cholesterol and lower LDL cholesterol. Omega-3s help reduce triglycerides and raise HDL cholesterol. Limit Saturated Fats: Sources: Red meat, full-fat dairy products, butter, and coconut oil. Benefits: Reducing intake helps lower LDL cholesterol levels.  Handouts Provided Include  List of non-starchy vegetables  Learning Style & Readiness for Change Teaching method utilized: Visual & Auditory  Demonstrated degree of understanding via: Teach Back  Barriers to learning/adherence to lifestyle change: none  Goals Established by Pt Previous Goals: Eat something for breakfast within 1-2 hours of waking up (carb + protein). - in progress, continue.  Aim to include a non-starchy vegetable with lunch and dinner. - in progress, continue.   Aim for 150 minutes of physical activity weekly: recommend PT exercises at home and chair workout on Youtube.- in progress, continue.   Aim for 64 oz water daily. - in progress, continue.   Aim to switch saturated fat sources to unsaturated and omega 3's. - in progress, continue.   New Goals:  Eat 3 times per day (at least 2 meals and a snack, or 3 meals per day)  MONITORING & EVALUATION Dietary intake, weekly physical activity, and follow up in 3 months.  Next Steps  Patient is to call for questions.

## 2023-03-04 ENCOUNTER — Inpatient Hospital Stay: Payer: Medicaid Other

## 2023-03-04 ENCOUNTER — Encounter: Payer: Self-pay | Admitting: Oncology

## 2023-03-04 ENCOUNTER — Inpatient Hospital Stay: Payer: Medicaid Other | Attending: Oncology | Admitting: Oncology

## 2023-03-04 VITALS — BP 136/69 | HR 87 | Temp 97.1°F | Wt 233.5 lb

## 2023-03-04 DIAGNOSIS — D75838 Other thrombocytosis: Secondary | ICD-10-CM | POA: Insufficient documentation

## 2023-03-04 DIAGNOSIS — Z7984 Long term (current) use of oral hypoglycemic drugs: Secondary | ICD-10-CM | POA: Diagnosis not present

## 2023-03-04 DIAGNOSIS — D509 Iron deficiency anemia, unspecified: Secondary | ICD-10-CM | POA: Diagnosis present

## 2023-03-04 DIAGNOSIS — E119 Type 2 diabetes mellitus without complications: Secondary | ICD-10-CM | POA: Insufficient documentation

## 2023-03-04 LAB — CBC (CANCER CENTER ONLY)
HCT: 35.5 % — ABNORMAL LOW (ref 36.0–46.0)
Hemoglobin: 11 g/dL — ABNORMAL LOW (ref 12.0–15.0)
MCH: 23.2 pg — ABNORMAL LOW (ref 26.0–34.0)
MCHC: 31 g/dL (ref 30.0–36.0)
MCV: 74.9 fL — ABNORMAL LOW (ref 80.0–100.0)
Platelet Count: 402 10*3/uL — ABNORMAL HIGH (ref 150–400)
RBC: 4.74 MIL/uL (ref 3.87–5.11)
RDW: 14.7 % (ref 11.5–15.5)
WBC Count: 5.2 10*3/uL (ref 4.0–10.5)
nRBC: 0 % (ref 0.0–0.2)

## 2023-03-04 LAB — IRON AND TIBC
Iron: 99 ug/dL (ref 28–170)
Saturation Ratios: 19 % (ref 10.4–31.8)
TIBC: 536 ug/dL — ABNORMAL HIGH (ref 250–450)
UIBC: 437 ug/dL

## 2023-03-04 LAB — FOLATE: Folate: 12.2 ng/mL (ref >5.9)

## 2023-03-04 LAB — VITAMIN B12: Vitamin B-12: 353 pg/mL (ref 180–914)

## 2023-03-04 LAB — FERRITIN: Ferritin: 11 ng/mL (ref 11–307)

## 2023-03-04 NOTE — Progress Notes (Signed)
[facility]  Telephone:([phone]) [phone] Fax:([phone]) [phone]  ID: Deborha Payment OB: 2001-07-18  MR#: [id number]  AOZ#:[id number]  Patient Care Team: [facility] as PCP - General  CHIEF COMPLAINT: Iron deficiency anemia.  INTERVAL HISTORY: Patient is a 22 year old female who was noted to have a decreased hemoglobin and iron stores on routine blood work.  She currently feels well and is asymptomatic.  She does not complain of any weakness or fatigue.  She has good appetite and denies weight loss.  She denies any recent fevers or illnesses.  She has no chest pain, shortness of breath, cough, or hemoptysis.  She denies any nausea, vomiting, constipation, or diarrhea.  She has no melena or hematochezia.  She has no urinary complaints.  Patient offers no specific complaints.  REVIEW OF SYSTEMS:   Review of Systems  Constitutional: Negative.  Negative for fever, malaise/fatigue and weight loss.  Respiratory: Negative.  Negative for cough, hemoptysis and shortness of breath.   Cardiovascular: Negative.  Negative for chest pain and leg swelling.  Gastrointestinal: Negative.  Negative for abdominal pain, blood in stool and melena.  Genitourinary: Negative.  Negative for hematuria.  Musculoskeletal: Negative.  Negative for back pain.  Skin: Negative.  Negative for rash.  Neurological: Negative.  Negative for dizziness, focal weakness, weakness and headaches.  Psychiatric/Behavioral: Negative.  The patient is not nervous/anxious.     As per HPI. Otherwise, a complete review of systems is negative.  PAST MEDICAL HISTORY: Past Medical History:  Diagnosis Date   Diabetes mellitus without complication (HCC)     PAST SURGICAL HISTORY: Past Surgical History:  Procedure Laterality Date   TONSILLECTOMY      FAMILY HISTORY: No family history on file.  ADVANCED DIRECTIVES (Y/N):  N  HEALTH MAINTENANCE: Social History   Tobacco Use   Smoking status: Never     Passive exposure: Current   Smokeless tobacco: Never  Vaping Use   Vaping Use: Never used  Substance Use Topics   Alcohol use: Yes   Drug use: Never     Colonoscopy:  PAP:  Bone density:  Lipid panel:  No Known Allergies  Current Outpatient Medications  Medication Sig Dispense Refill   dicyclomine (BENTYL) 20 MG tablet Take 1 tablet (20 mg total) by mouth 2 (two) times daily as needed for spasms. 20 tablet 0   glucagon 1 MG injection Inject 1 mg into the muscle as needed.     insulin aspart (NOVOLOG FLEXPEN) 100 UNIT/ML FlexPen Inject 20 Units into the skin 3 (three) times daily before meals. Patient taking 20 units before breakfast and lunch, 30 units before supper     Insulin Glargine (LANTUS SOLOSTAR) 100 UNIT/ML Solostar Pen Inject 40 Units into the skin at bedtime.     meloxicam (MOBIC) 15 MG tablet Take 1 tablet (15 mg total) by mouth daily. 30 tablet 0   metFORMIN (GLUCOPHAGE-XR) 500 MG 24 hr tablet Take by mouth.     methocarbamol (ROBAXIN) 500 MG tablet Take 1 tablet (500 mg total) by mouth 2 (two) times daily. 20 tablet 0   ondansetron (ZOFRAN) 4 MG tablet Take 1 tablet (4 mg total) by mouth every 6 (six) hours as needed for nausea or vomiting. 12 tablet 0   Vitamin D, Ergocalciferol, (DRISDOL) 1.25 MG (50000 UT) CAPS capsule Take 50,000 Units by mouth once a week.     No current facility-administered medications for this visit.    OBJECTIVE: Vitals:   03/04/23 1154  BP:  136/69  Pulse: 87  Temp: (!) 97.1 F (36.2 C)  SpO2: 96%     Body mass index is 41.36 kg/m.    ECOG FS:0 - Asymptomatic  General: Well-developed, well-nourished, no acute distress. Eyes: Pink conjunctiva, anicteric sclera. HEENT: Normocephalic, moist mucous membranes. Lungs: No audible wheezing or coughing. Heart: Regular rate and rhythm. Abdomen: Soft, nontender, no obvious distention. Musculoskeletal: No edema, cyanosis, or clubbing. Neuro: Alert, answering all questions appropriately.  Cranial nerves grossly intact. Skin: No rashes or petechiae noted. Psych: Normal affect. Lymphatics: No cervical, calvicular, axillary or inguinal LAD.   LAB RESULTS:  Lab Results  Component Value Date   NA 136 11/13/2022   K 4.2 11/13/2022   CL 102 11/13/2022   CO2 23 11/13/2022   GLUCOSE 271 (H) 11/13/2022   BUN 11 11/13/2022   CREATININE 0.83 11/13/2022   CALCIUM 9.4 11/13/2022   PROT 7.9 11/13/2022   ALBUMIN 3.6 11/13/2022   AST 18 11/13/2022   ALT 12 11/13/2022   ALKPHOS 94 11/13/2022   BILITOT 0.3 11/13/2022   GFRNONAA >60 11/13/2022    Lab Results  Component Value Date   WBC 5.2 03/04/2023   NEUTROABS 3.6 09/29/2012   HGB 11.0 (L) 03/04/2023   HCT 35.5 (L) 03/04/2023   MCV 74.9 (L) 03/04/2023   PLT 402 (H) 03/04/2023   No results found for: "IRON", "TIBC", "IRONPCTSAT"  No results found for: "FERRITIN"   STUDIES: No results found.  ASSESSMENT: Iron deficiency anemia.  PLAN:    Iron deficiency anemia: Likely secondary to menses.  Patient also has a colonoscopy scheduled on May 25, 2023.  Patient's hemoglobin is only mildly decreased at 11.0.  Previous iron stores in March 2024 were significantly reduced, repeat results are pending at time of dictation.  B12 and folate have also been drawn for completeness.  Patient will return 5 times over the next 2 to 3 weeks to receive 200 mg IV Venofer.  Patient then return to clinic in 4 months with repeat laboratory, further evaluation, and consideration of additional IV iron if necessary. 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  Monitor.  I spent a total of 45 minutes reviewing chart data, face-to-face evaluation with the patient, counseling and coordination of care as detailed above.   Patient expressed understanding and was in agreement with this plan. She also understands that She can call clinic at any time with any questions, concerns, or complaints.    Jeralyn Ruths, MD   03/04/2023 1:55  PM

## 2023-03-05 ENCOUNTER — Inpatient Hospital Stay: Payer: Medicaid Other

## 2023-03-05 NOTE — Progress Notes (Signed)
[facility] Clinical Social Work  Clinical Social Work was referred by medical provider for assessment of psychosocial needs.  Clinical Social Worker contacted patient by phone to offer support and assess for needs.    Discussed patient's financial concerns.  She said she did inform staff recently that she could not afford an iron medication.  That has changed since she will be getting iron transfusions.  CSW provided contact information for future needs.     Dorothey Baseman, LCSW  Clinical Social Worker [facility]        Patient is participating in a Managed Medicaid Plan:  Yes

## 2023-03-10 ENCOUNTER — Inpatient Hospital Stay: Payer: Medicaid Other

## 2023-03-10 VITALS — BP 125/64 | HR 98 | Temp 98.2°F

## 2023-03-10 DIAGNOSIS — D509 Iron deficiency anemia, unspecified: Secondary | ICD-10-CM

## 2023-03-10 MED ORDER — SODIUM CHLORIDE 0.9 % IV SOLN
Freq: Once | INTRAVENOUS | Status: AC
  Filled 2023-03-10: qty 250

## 2023-03-10 MED ORDER — SODIUM CHLORIDE 0.9 % IV SOLN
200.0000 mg | Freq: Once | INTRAVENOUS | Status: AC
  Administered 2023-03-10: 200 mg via INTRAVENOUS
  Filled 2023-03-10: qty 200

## 2023-03-10 NOTE — Patient Instructions (Signed)
Iron Sucrose Injection What is this medication? IRON SUCROSE (EYE ern SOO krose) treats low levels of iron (iron deficiency anemia) in people with kidney disease. Iron is a mineral that plays an important role in making red blood cells, which carry oxygen from your lungs to the rest of your body. This medicine may be used for other purposes; ask your health care provider or pharmacist if you have questions. COMMON BRAND NAME(S): Venofer What should I tell my care team before I take this medication? They need to know if you have any of these conditions: Anemia not caused by low iron levels Heart disease High levels of iron in the blood Kidney disease Liver disease An unusual or allergic reaction to iron, other medications, foods, dyes, or preservatives Pregnant or trying to get pregnant Breastfeeding How should I use this medication? This medication is for infusion into a vein. It is given in a hospital or clinic setting. Talk to your care team about the use of this medication in children. While this medication may be prescribed for children as young as 2 years for selected conditions, precautions do apply. Overdosage: If you think you have taken too much of this medicine contact a poison control center or emergency room at once. NOTE: This medicine is only for you. Do not share this medicine with others. What if I miss a dose? Keep appointments for follow-up doses. It is important not to miss your dose. Call your care team if you are unable to keep an appointment. What may interact with this medication? Do not take this medication with any of the following: Deferoxamine Dimercaprol Other iron products This medication may also interact with the following: Chloramphenicol Deferasirox This list may not describe all possible interactions. Give your health care provider a list of all the medicines, herbs, non-prescription drugs, or dietary supplements you use. Also tell them if you smoke,  drink alcohol, or use illegal drugs. Some items may interact with your medicine. What should I watch for while using this medication? Visit your care team regularly. Tell your care team if your symptoms do not start to get better or if they get worse. You may need blood work done while you are taking this medication. You may need to follow a special diet. Talk to your care team. Foods that contain iron include: whole grains/cereals, dried fruits, beans, or peas, leafy green vegetables, and organ meats (liver, kidney). What side effects may I notice from receiving this medication? Side effects that you should report to your care team as soon as possible: Allergic reactions--skin rash, itching, hives, swelling of the face, lips, tongue, or throat Low blood pressure--dizziness, feeling faint or lightheaded, blurry vision Shortness of breath Side effects that usually do not require medical attention (report to your care team if they continue or are bothersome): Flushing Headache Joint pain Muscle pain Nausea Pain, redness, or irritation at injection site This list may not describe all possible side effects. Call your doctor for medical advice about side effects. You may report side effects to FDA at 1-800-FDA-1088. Where should I keep my medication? This medication is given in a hospital or clinic and will not be stored at home. NOTE: This sheet is a summary. It may not cover all possible information. If you have questions about this medicine, talk to your doctor, pharmacist, or health care provider.  2024 Elsevier/Gold Standard (2022-02-19 00:00:00)  

## 2023-03-13 ENCOUNTER — Inpatient Hospital Stay: Payer: Medicaid Other

## 2023-03-13 VITALS — BP 138/80 | HR 97 | Temp 97.8°F

## 2023-03-13 DIAGNOSIS — D509 Iron deficiency anemia, unspecified: Secondary | ICD-10-CM

## 2023-03-13 MED ORDER — SODIUM CHLORIDE 0.9 % IV SOLN
Freq: Once | INTRAVENOUS | Status: AC
  Filled 2023-03-13: qty 250

## 2023-03-13 MED ORDER — SODIUM CHLORIDE 0.9 % IV SOLN
200.0000 mg | Freq: Once | INTRAVENOUS | Status: AC
  Administered 2023-03-13: 200 mg via INTRAVENOUS
  Filled 2023-03-13: qty 200

## 2023-03-13 MED ORDER — SODIUM CHLORIDE 0.9% FLUSH
10.0000 mL | Freq: Once | INTRAVENOUS | Status: AC | PRN
  Administered 2023-03-13: 10 mL
  Filled 2023-03-13: qty 10

## 2023-03-13 NOTE — Patient Instructions (Signed)
Iron Sucrose Injection What is this medication? IRON SUCROSE (EYE ern SOO krose) treats low levels of iron (iron deficiency anemia) in people with kidney disease. Iron is a mineral that plays an important role in making red blood cells, which carry oxygen from your lungs to the rest of your body. This medicine may be used for other purposes; ask your health care provider or pharmacist if you have questions. COMMON BRAND NAME(S): Venofer What should I tell my care team before I take this medication? They need to know if you have any of these conditions: Anemia not caused by low iron levels Heart disease High levels of iron in the blood Kidney disease Liver disease An unusual or allergic reaction to iron, other medications, foods, dyes, or preservatives Pregnant or trying to get pregnant Breastfeeding How should I use this medication? This medication is for infusion into a vein. It is given in a hospital or clinic setting. Talk to your care team about the use of this medication in children. While this medication may be prescribed for children as young as 2 years for selected conditions, precautions do apply. Overdosage: If you think you have taken too much of this medicine contact a poison control center or emergency room at once. NOTE: This medicine is only for you. Do not share this medicine with others. What if I miss a dose? Keep appointments for follow-up doses. It is important not to miss your dose. Call your care team if you are unable to keep an appointment. What may interact with this medication? Do not take this medication with any of the following: Deferoxamine Dimercaprol Other iron products This medication may also interact with the following: Chloramphenicol Deferasirox This list may not describe all possible interactions. Give your health care provider a list of all the medicines, herbs, non-prescription drugs, or dietary supplements you use. Also tell them if you smoke,  drink alcohol, or use illegal drugs. Some items may interact with your medicine. What should I watch for while using this medication? Visit your care team regularly. Tell your care team if your symptoms do not start to get better or if they get worse. You may need blood work done while you are taking this medication. You may need to follow a special diet. Talk to your care team. Foods that contain iron include: whole grains/cereals, dried fruits, beans, or peas, leafy green vegetables, and organ meats (liver, kidney). What side effects may I notice from receiving this medication? Side effects that you should report to your care team as soon as possible: Allergic reactions--skin rash, itching, hives, swelling of the face, lips, tongue, or throat Low blood pressure--dizziness, feeling faint or lightheaded, blurry vision Shortness of breath Side effects that usually do not require medical attention (report to your care team if they continue or are bothersome): Flushing Headache Joint pain Muscle pain Nausea Pain, redness, or irritation at injection site This list may not describe all possible side effects. Call your doctor for medical advice about side effects. You may report side effects to FDA at 1-800-FDA-1088. Where should I keep my medication? This medication is given in a hospital or clinic. It will not be stored at home. NOTE: This sheet is a summary. It may not cover all possible information. If you have questions about this medicine, talk to your doctor, pharmacist, or health care provider.  2024 Elsevier/Gold Standard (2023-01-16 00:00:00)

## 2023-03-17 ENCOUNTER — Inpatient Hospital Stay: Payer: Medicaid Other

## 2023-03-17 VITALS — BP 134/80 | HR 103 | Temp 97.6°F | Resp 16

## 2023-03-17 DIAGNOSIS — D509 Iron deficiency anemia, unspecified: Secondary | ICD-10-CM

## 2023-03-17 MED ORDER — SODIUM CHLORIDE 0.9 % IV SOLN
200.0000 mg | Freq: Once | INTRAVENOUS | Status: AC
  Administered 2023-03-17: 200 mg via INTRAVENOUS
  Filled 2023-03-17: qty 200

## 2023-03-17 MED ORDER — SODIUM CHLORIDE 0.9 % IV SOLN
Freq: Once | INTRAVENOUS | Status: AC
  Filled 2023-03-17: qty 250

## 2023-03-17 NOTE — Progress Notes (Signed)
Pt tolerated treatment without concerns.  VSS.  Pt refused 30 minute post observation.  Pt understands risks.   

## 2023-03-17 NOTE — Patient Instructions (Signed)
Iron Sucrose Injection What is this medication? IRON SUCROSE (EYE ern SOO krose) treats low levels of iron (iron deficiency anemia) in people with kidney disease. Iron is a mineral that plays an important role in making red blood cells, which carry oxygen from your lungs to the rest of your body. This medicine may be used for other purposes; ask your health care provider or pharmacist if you have questions. COMMON BRAND NAME(S): Venofer What should I tell my care team before I take this medication? They need to know if you have any of these conditions: Anemia not caused by low iron levels Heart disease High levels of iron in the blood Kidney disease Liver disease An unusual or allergic reaction to iron, other medications, foods, dyes, or preservatives Pregnant or trying to get pregnant Breastfeeding How should I use this medication? This medication is for infusion into a vein. It is given in a hospital or clinic setting. Talk to your care team about the use of this medication in children. While this medication may be prescribed for children as young as 2 years for selected conditions, precautions do apply. Overdosage: If you think you have taken too much of this medicine contact a poison control center or emergency room at once. NOTE: This medicine is only for you. Do not share this medicine with others. What if I miss a dose? Keep appointments for follow-up doses. It is important not to miss your dose. Call your care team if you are unable to keep an appointment. What may interact with this medication? Do not take this medication with any of the following: Deferoxamine Dimercaprol Other iron products This medication may also interact with the following: Chloramphenicol Deferasirox This list may not describe all possible interactions. Give your health care provider a list of all the medicines, herbs, non-prescription drugs, or dietary supplements you use. Also tell them if you smoke,  drink alcohol, or use illegal drugs. Some items may interact with your medicine. What should I watch for while using this medication? Visit your care team regularly. Tell your care team if your symptoms do not start to get better or if they get worse. You may need blood work done while you are taking this medication. You may need to follow a special diet. Talk to your care team. Foods that contain iron include: whole grains/cereals, dried fruits, beans, or peas, leafy green vegetables, and organ meats (liver, kidney). What side effects may I notice from receiving this medication? Side effects that you should report to your care team as soon as possible: Allergic reactions--skin rash, itching, hives, swelling of the face, lips, tongue, or throat Low blood pressure--dizziness, feeling faint or lightheaded, blurry vision Shortness of breath Side effects that usually do not require medical attention (report to your care team if they continue or are bothersome): Flushing Headache Joint pain Muscle pain Nausea Pain, redness, or irritation at injection site This list may not describe all possible side effects. Call your doctor for medical advice about side effects. You may report side effects to FDA at 1-800-FDA-1088. Where should I keep my medication? This medication is given in a hospital or clinic. It will not be stored at home. NOTE: This sheet is a summary. It may not cover all possible information. If you have questions about this medicine, talk to your doctor, pharmacist, or health care provider.  2024 Elsevier/Gold Standard (2023-01-16 00:00:00)

## 2023-03-19 ENCOUNTER — Encounter: Payer: Self-pay | Admitting: Urology

## 2023-03-19 ENCOUNTER — Ambulatory Visit: Payer: Medicaid Other | Admitting: Urology

## 2023-03-19 VITALS — BP 127/85 | HR 86 | Ht 63.0 in | Wt 235.0 lb

## 2023-03-19 DIAGNOSIS — N3289 Other specified disorders of bladder: Secondary | ICD-10-CM | POA: Diagnosis not present

## 2023-03-19 DIAGNOSIS — R300 Dysuria: Secondary | ICD-10-CM

## 2023-03-19 LAB — URINALYSIS, COMPLETE
Bilirubin, UA: NEGATIVE
Glucose, UA: NEGATIVE
Ketones, UA: NEGATIVE
Leukocytes,UA: NEGATIVE
Nitrite, UA: NEGATIVE
Protein,UA: NEGATIVE
RBC, UA: NEGATIVE
Specific Gravity, UA: 1.025 (ref 1.005–1.030)
Urobilinogen, Ur: 0.2 mg/dL (ref 0.2–1.0)
pH, UA: 5.5 (ref 5.0–7.5)

## 2023-03-19 LAB — MICROSCOPIC EXAMINATION: Epithelial Cells (non renal): >10 /hpf — AB (ref 0–10)

## 2023-03-19 LAB — BLADDER SCAN: Scan Result: 68

## 2023-03-19 NOTE — Progress Notes (Signed)
Marcelle Overlie Plume,acting as a scribe for Riki Altes, MD.,have documented all relevant documentation on the behalf of Riki Altes, MD,as directed by  Riki Altes, MD while in the presence of Riki Altes, MD.  03/19/2023 10:42 AM   Donna Mcguire June 24, 2001 [id number]  Referring provider: [facility] [address] [place],  [place] [address]  Chief Complaint  Patient presents with   Establish Care   Urinary Urgency    HPI: Donna Mcguire is a 22 y.o. female who is referred for bladder wall thickening.  Evaluated by gastroenterology for irritable bowel and hepatic findings. CT scan performed 11/14/2022 showed mild bladder wall thickening.  CT scan performed 07/19/2021 showed similar findings She has no bothersome lower urinary tract symptoms Denies dysuria, gross hematuria No recurrent UTI No flank, abdominal, or pelvic pain   PMH: Past Medical History:  Diagnosis Date   Diabetes mellitus without complication (HCC)     Surgical History: Past Surgical History:  Procedure Laterality Date   FOOT SURGERY     TONSILLECTOMY      Home Medications:  Allergies as of 03/19/2023   No Known Allergies      Medication List        Accurate as of March 19, 2023 10:42 AM. If you have any questions, ask your nurse or doctor.          STOP taking these medications    meloxicam 15 MG tablet Commonly known as: Mobic Stopped by: Riki Altes       TAKE these medications    dicyclomine 20 MG tablet Commonly known as: BENTYL Take 1 tablet (20 mg total) by mouth 2 (two) times daily as needed for spasms.   glucagon 1 MG injection Inject 1 mg into the muscle as needed.   Lantus SoloStar 100 UNIT/ML Solostar Pen Generic drug: insulin glargine Inject 40 Units into the skin at bedtime.   metFORMIN 500 MG 24 hr tablet Commonly known as: GLUCOPHAGE-XR Take by mouth.   methocarbamol 500 MG tablet Commonly known as: ROBAXIN Take 1 tablet  (500 mg total) by mouth 2 (two) times daily.   NovoLOG FlexPen 100 UNIT/ML FlexPen Generic drug: insulin aspart Inject 20 Units into the skin 3 (three) times daily before meals. Patient taking 20 units before breakfast and lunch, 30 units before supper   ondansetron 4 MG tablet Commonly known as: ZOFRAN Take 1 tablet (4 mg total) by mouth every 6 (six) hours as needed for nausea or vomiting.   Vitamin D (Ergocalciferol) 1.25 MG (50000 UNIT) Caps capsule Commonly known as: DRISDOL Take 50,000 Units by mouth once a week.         Social History:  reports that she has never smoked. She has been exposed to tobacco smoke. She has never used smokeless tobacco. She reports that she does not currently use alcohol. She reports that she does not use drugs.   Physical Exam: BP 127/85   Pulse 86   Ht 5\' 3"  (1.6 m)   Wt 235 lb (106.6 kg)   BMI 41.63 kg/m   Constitutional:  Alert and oriented, No acute distress. HEENT: [place] AT Respiratory: Normal respiratory effort, no increased work of breathing. Psychiatric: Normal mood and affect.  Laboratory data:  Urinalysis: Dipstick: negative Microscopy: >10 epithelials/ 6-10 WBC  Pertinent Imaging  CT scans from 2022 and 2024 were personally reviewed and interpreted.   EXAM: CT ABDOMEN AND PELVIS WITH CONTRAST   TECHNIQUE: Multidetector  CT imaging of the abdomen and pelvis was performed using the standard protocol following bolus administration of intravenous contrast.   CONTRAST:  OMNIPAQUE IOHEXOL 300 MG/ML  SOLN   COMPARISON:  None.   FINDINGS: Lower chest: No acute abnormality.   Hepatobiliary: A 7.4 cm x 2.3 cm x 4.8 cm ill-defined area of heterogeneous low attenuation is seen along the anterior aspect of the right and left lobe of the liver. No gallstones, gallbladder wall thickening, or biliary dilatation.   Pancreas: Unremarkable. No pancreatic ductal dilatation or surrounding inflammatory changes.   Spleen:  Normal in size without focal abnormality.   Adrenals/Urinary Tract: Adrenal glands are unremarkable. Kidneys are normal, without renal calculi, focal lesion, or hydronephrosis. There is moderate severity diffuse urinary bladder wall thickening.   Stomach/Bowel: Stomach is within normal limits. Appendix appears normal. No evidence of bowel wall thickening, distention, or inflammatory changes.   Vascular/Lymphatic: No significant vascular findings are present. Subcentimeter mesenteric lymph nodes are seen within the lower abdomen.   Reproductive: Uterus and bilateral adnexa are unremarkable.   Other: No abdominal wall hernia or abnormality. No abdominopelvic ascites.   Musculoskeletal: No acute or significant osseous findings.   IMPRESSION: 1. Ill-defined area of heterogeneous low attenuation along the anterior aspect of the right and left lobe of the liver which may represent a hemangioma. Correlation with nonemergent follow-up MRI abdomen is recommended to exclude the presence of an underlying neoplastic process. 2. Moderate severity diffuse urinary bladder wall thickening which may represent cystitis. Correlation with urinalysis is recommended.     Electronically Signed   By: Aram Candela M.D.   On: 07/19/2021 22:38  EXAM: CT ABDOMEN AND PELVIS WITH CONTRAST   TECHNIQUE: Multidetector CT imaging of the abdomen and pelvis was performed using the standard protocol following bolus administration of intravenous contrast.   RADIATION DOSE REDUCTION: This exam was performed according to the departmental dose-optimization program which includes automated exposure control, adjustment of the mA and/or kV according to patient size and/or use of iterative reconstruction technique.   CONTRAST:  OMNIPAQUE IOHEXOL 300 MG/ML  SOLN   COMPARISON:  07/19/2021   FINDINGS: Lower chest: No acute findings   Hepatobiliary: Low-density anteriorly in the liver adjacent to  the falciform ligament. This is compatible with focal fatty infiltration. No suspicious focal hepatic abnormality. Gallbladder unremarkable.   Pancreas: No focal abnormality or ductal dilatation.   Spleen: No focal abnormality.  Normal size.   Adrenals/Urinary Tract: No adrenal abnormality. No focal renal abnormality. No stones or hydronephrosis. Urinary bladder wall mildly thickened, similar to prior study.   Stomach/Bowel: Appendix is normal. Stomach, large and small bowel grossly unremarkable.   Vascular/Lymphatic: No evidence of aneurysm or adenopathy.   Reproductive: Uterus and adnexa unremarkable.  No mass.   Other: No free fluid or free air.   Musculoskeletal: No acute bony abnormality.   IMPRESSION: Mild urinary bladder wall thickening. This could reflect cystitis. This is stable since prior study.   Normal appendix.     Electronically Signed   By: Charlett Nose M.D.   On: 11/14/2022 01:03   Assessment & Plan:    1. Bladder wall thickening Mild bladder wall thickening, asymptomatic,non-specific finding PVR obtained approximately 30 minutes after voiding was 68 mL Follow up PRN  I have reviewed the above documentation for accuracy and completeness, and I agree with the above.   Riki Altes, MD  [facility] [address], [address] [place], [place] [address] [phone]  [phone]  

## 2023-03-20 ENCOUNTER — Inpatient Hospital Stay: Payer: Medicaid Other

## 2023-03-20 ENCOUNTER — Ambulatory Visit: Payer: Medicaid Other

## 2023-03-20 VITALS — BP 130/76 | HR 92 | Temp 98.4°F | Resp 18

## 2023-03-20 DIAGNOSIS — D509 Iron deficiency anemia, unspecified: Secondary | ICD-10-CM | POA: Diagnosis not present

## 2023-03-20 MED ORDER — SODIUM CHLORIDE 0.9 % IV SOLN
Freq: Once | INTRAVENOUS | Status: AC
  Filled 2023-03-20: qty 250

## 2023-03-20 MED ORDER — SODIUM CHLORIDE 0.9 % IV SOLN
200.0000 mg | Freq: Once | INTRAVENOUS | Status: AC
  Administered 2023-03-20: 200 mg via INTRAVENOUS
  Filled 2023-03-20: qty 200

## 2023-03-23 MED FILL — Iron Sucrose Inj 20 MG/ML (Fe Equiv): INTRAVENOUS | Qty: 10 | Status: AC

## 2023-03-24 ENCOUNTER — Ambulatory Visit: Payer: Medicaid Other

## 2023-03-24 ENCOUNTER — Inpatient Hospital Stay: Payer: Medicaid Other

## 2023-03-27 ENCOUNTER — Inpatient Hospital Stay: Payer: Medicaid Other | Attending: Oncology

## 2023-03-27 VITALS — BP 111/74 | HR 96 | Temp 99.3°F | Resp 16

## 2023-03-27 DIAGNOSIS — D509 Iron deficiency anemia, unspecified: Secondary | ICD-10-CM | POA: Diagnosis present

## 2023-03-27 MED ORDER — SODIUM CHLORIDE 0.9 % IV SOLN
200.0000 mg | Freq: Once | INTRAVENOUS | Status: AC
  Administered 2023-03-27: 200 mg via INTRAVENOUS
  Filled 2023-03-27: qty 200

## 2023-03-27 MED ORDER — SODIUM CHLORIDE 0.9 % IV SOLN
Freq: Once | INTRAVENOUS | Status: AC
  Filled 2023-03-27: qty 250

## 2023-03-27 NOTE — Patient Instructions (Signed)
Iron Sucrose Injection What is this medication? IRON SUCROSE (EYE ern SOO krose) treats low levels of iron (iron deficiency anemia) in people with kidney disease. Iron is a mineral that plays an important role in making red blood cells, which carry oxygen from your lungs to the rest of your body. This medicine may be used for other purposes; ask your health care provider or pharmacist if you have questions. COMMON BRAND NAME(S): Venofer What should I tell my care team before I take this medication? They need to know if you have any of these conditions: Anemia not caused by low iron levels Heart disease High levels of iron in the blood Kidney disease Liver disease An unusual or allergic reaction to iron, other medications, foods, dyes, or preservatives Pregnant or trying to get pregnant Breastfeeding How should I use this medication? This medication is for infusion into a vein. It is given in a hospital or clinic setting. Talk to your care team about the use of this medication in children. While this medication may be prescribed for children as young as 2 years for selected conditions, precautions do apply. Overdosage: If you think you have taken too much of this medicine contact a poison control center or emergency room at once. NOTE: This medicine is only for you. Do not share this medicine with others. What if I miss a dose? Keep appointments for follow-up doses. It is important not to miss your dose. Call your care team if you are unable to keep an appointment. What may interact with this medication? Do not take this medication with any of the following: Deferoxamine Dimercaprol Other iron products This medication may also interact with the following: Chloramphenicol Deferasirox This list may not describe all possible interactions. Give your health care provider a list of all the medicines, herbs, non-prescription drugs, or dietary supplements you use. Also tell them if you smoke,  drink alcohol, or use illegal drugs. Some items may interact with your medicine. What should I watch for while using this medication? Visit your care team regularly. Tell your care team if your symptoms do not start to get better or if they get worse. You may need blood work done while you are taking this medication. You may need to follow a special diet. Talk to your care team. Foods that contain iron include: whole grains/cereals, dried fruits, beans, or peas, leafy green vegetables, and organ meats (liver, kidney). What side effects may I notice from receiving this medication? Side effects that you should report to your care team as soon as possible: Allergic reactions--skin rash, itching, hives, swelling of the face, lips, tongue, or throat Low blood pressure--dizziness, feeling faint or lightheaded, blurry vision Shortness of breath Side effects that usually do not require medical attention (report to your care team if they continue or are bothersome): Flushing Headache Joint pain Muscle pain Nausea Pain, redness, or irritation at injection site This list may not describe all possible side effects. Call your doctor for medical advice about side effects. You may report side effects to FDA at 1-800-FDA-1088. Where should I keep my medication? This medication is given in a hospital or clinic. It will not be stored at home. NOTE: This sheet is a summary. It may not cover all possible information. If you have questions about this medicine, talk to your doctor, pharmacist, or health care provider.  2024 Elsevier/Gold Standard (2023-01-16 00:00:00)

## 2023-03-27 NOTE — Progress Notes (Signed)
Pt tolerated treatment without complaints.  VSS.  Pt refused 30 minute post observation.  Pt understands risks.   

## 2023-05-12 ENCOUNTER — Encounter: Payer: Medicaid Other | Attending: Gastroenterology | Admitting: Dietician

## 2023-05-12 ENCOUNTER — Encounter: Payer: Self-pay | Admitting: Dietician

## 2023-05-12 DIAGNOSIS — E119 Type 2 diabetes mellitus without complications: Secondary | ICD-10-CM | POA: Diagnosis not present

## 2023-05-12 DIAGNOSIS — Z713 Dietary counseling and surveillance: Secondary | ICD-10-CM | POA: Diagnosis not present

## 2023-05-12 DIAGNOSIS — K760 Fatty (change of) liver, not elsewhere classified: Secondary | ICD-10-CM | POA: Insufficient documentation

## 2023-05-12 DIAGNOSIS — E559 Vitamin D deficiency, unspecified: Secondary | ICD-10-CM | POA: Insufficient documentation

## 2023-05-12 NOTE — Progress Notes (Signed)
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    Referral diagnosis: fatty liver Preferred learning style: no preference indicated Learning readiness: ready   NUTRITION ASSESSMENT   Anthropometrics  Weight: 05/12/23: 239 lb  Clinical Medical Hx: type 2 diabetes, NAFLD, vitamin D deficiency Medications: insulin aspart (novolog), insulin glargine (lantus), metformin, glucagon Labs: A1c 7.3 05/11/23 Notable Signs/Symptoms: nausea/diarrhea  Food Allergies: tomatoes (stomach pain)  Lifestyle & Dietary Hx  Pt present with partner.   Pt states appetite is better and she has been eating more consistently.   Pt states she feel like she regressed in her nutrition and states she has been eating more fast food and not drinking as much water as she hoped.   Pt states she has been eating fish once per week.   Pt states she either gets a lot of sleep or not a lot. She states she has severe insomnia and will sleep 3-6 hours for days and then sleep 12.   Pt states she finished physical therapy and can now walk more.   Estimated daily fluid intake: 32 oz Supplements: vitamin D Sleep: 4-10 hours Stress / self-care: moderate-to-high, enjoys crochet Current average weekly physical activity: physical therapy and some walking  24-Hr Dietary Recall First Meal: none Snack: none Second Meal: wendys meal Snack: none OR crackers OR chips Third Meal: protein with rice and vegetables OR saag with yogurt and squash Snack: none Beverages: water, soda or juice 1x/wk.    NUTRITION DIAGNOSIS  NB-1.1 Food and nutrition-related knowledge deficit As related to lack of prior nutrition education by a nutrition professional.  As evidenced by pt report.   NUTRITION INTERVENTION  Nutrition education (E-1) on the following topics:  Impact of sleep on nutrition: Inadequate sleep significantly  impacts nutrition by increasing appetite and cravings for high-calorie foods, altering food choices, and disrupting metabolic processes. This can lead to weight gain, poor glucose management, and impaired nutrient absorption. Additionally, sleep deprivation often results in reduced physical activity, further affecting overall health. Tips for better sleep: To improve sleep quality, maintain a consistent sleep schedule and create a relaxing bedtime routine. Optimize your sleep environment by keeping it cool, dark, and quiet, and limit screen time before bed. Be mindful of food and drink intake, engage in regular physical activity, and manage stress effectively. By prioritizing better sleep habits, you can positively influence your nutrition and overall well-being.   Handouts Provided Include  No handouts provided on this follow up  Learning Style & Readiness for Change Teaching method utilized: Visual & Auditory  Demonstrated degree of understanding via: Teach Back  Barriers to learning/adherence to lifestyle change: none  Goals Established by Pt Previous Goals: Eat something for breakfast within 1-2 hours of waking up (carb + protein). - in progress, continue.  Aim for 64 oz water daily. - in progress, continue.   Aim to switch saturated fat sources to unsaturated and omega 3's. - in progress, continue.   Eat 3 times per day (at least 2 meals and a snack, or 3 meals per day). - in progress, continue.  New Goal:  Go walking more often! Have 1 vegetable every day!  MONITORING & EVALUATION Dietary intake, weekly physical activity, and follow up in 3 months.  Next Steps  Patient is to call for questions.

## 2023-05-25 ENCOUNTER — Ambulatory Visit: Admission: RE | Admit: 2023-05-25 | Payer: Medicaid Other | Source: Home / Self Care

## 2023-05-25 ENCOUNTER — Encounter: Admission: RE | Payer: Self-pay | Source: Home / Self Care

## 2023-05-25 SURGERY — COLONOSCOPY
Anesthesia: General

## 2023-05-26 ENCOUNTER — Other Ambulatory Visit: Payer: Self-pay | Admitting: Gastroenterology

## 2023-07-06 ENCOUNTER — Other Ambulatory Visit: Payer: Self-pay

## 2023-07-06 ENCOUNTER — Inpatient Hospital Stay: Payer: Medicaid Other | Attending: Oncology

## 2023-07-06 DIAGNOSIS — D509 Iron deficiency anemia, unspecified: Secondary | ICD-10-CM

## 2023-07-06 LAB — IRON AND TIBC
Iron: 74 ug/dL (ref 28–170)
Saturation Ratios: 15 % (ref 10.4–31.8)
TIBC: 500 ug/dL — ABNORMAL HIGH (ref 250–450)
UIBC: 426 ug/dL

## 2023-07-06 LAB — CBC WITH DIFFERENTIAL (CANCER CENTER ONLY)
Abs Immature Granulocytes: 0.02 10*3/uL (ref 0.00–0.07)
Basophils Absolute: 0.1 10*3/uL (ref 0.0–0.1)
Basophils Relative: 1 %
Eosinophils Absolute: 0.2 10*3/uL (ref 0.0–0.5)
Eosinophils Relative: 3 %
HCT: 40.1 % (ref 36.0–46.0)
Hemoglobin: 12.7 g/dL (ref 12.0–15.0)
Immature Granulocytes: 0 %
Lymphocytes Relative: 34 %
Lymphs Abs: 2.5 10*3/uL (ref 0.7–4.0)
MCH: 26.1 pg (ref 26.0–34.0)
MCHC: 31.7 g/dL (ref 30.0–36.0)
MCV: 82.5 fL (ref 80.0–100.0)
Monocytes Absolute: 0.5 10*3/uL (ref 0.1–1.0)
Monocytes Relative: 7 %
Neutro Abs: 4 10*3/uL (ref 1.7–7.7)
Neutrophils Relative %: 55 %
Platelet Count: 402 10*3/uL — ABNORMAL HIGH (ref 150–400)
RBC: 4.86 MIL/uL (ref 3.87–5.11)
RDW: 12.9 % (ref 11.5–15.5)
WBC Count: 7.2 10*3/uL (ref 4.0–10.5)
nRBC: 0 % (ref 0.0–0.2)

## 2023-07-06 LAB — FERRITIN: Ferritin: 101 ng/mL (ref 11–307)

## 2023-07-07 ENCOUNTER — Inpatient Hospital Stay (HOSPITAL_BASED_OUTPATIENT_CLINIC_OR_DEPARTMENT_OTHER): Payer: Medicaid Other | Admitting: Oncology

## 2023-07-07 ENCOUNTER — Inpatient Hospital Stay: Payer: Medicaid Other

## 2023-07-07 ENCOUNTER — Encounter: Payer: Self-pay | Admitting: Oncology

## 2023-07-07 VITALS — BP 127/73 | HR 101 | Temp 97.5°F | Resp 18 | Ht 63.0 in | Wt 235.0 lb

## 2023-07-07 DIAGNOSIS — D509 Iron deficiency anemia, unspecified: Secondary | ICD-10-CM | POA: Diagnosis not present

## 2023-07-07 NOTE — Progress Notes (Signed)
Worried about platelet levels being elevated.

## 2023-07-07 NOTE — Progress Notes (Signed)
[facility]  [phone] [phone]  ID: Donna Mcguire OB: [birth year]  MR#: [id number]  AOZ#:[id number]  Patient Care Team: [facility] as PCP - General Orlie Dakin Tollie Pizza, MD as Consulting Physician (Oncology)  CHIEF COMPLAINT: Iron deficiency anemia.  INTERVAL HISTORY: Patient returns to clinic today for repeat laboratory work, further evaluation, consideration of additional IV Venofer.  Patient states after her last treatment she initially felt well, but currently feels increased fatigue.  She has no neurologic complaints.  She has a good appetite and denies weight loss.  She denies any recent fevers or illnesses.  She has no chest pain, shortness of breath, cough, or hemoptysis.  She denies any nausea, vomiting, constipation, or diarrhea.  She has no melena or hematochezia.  She has no urinary complaints.  Patient offers no further specific complaints today.  REVIEW OF SYSTEMS:   Review of Systems  Constitutional:  Positive for malaise/fatigue. Negative for fever and weight loss.  Respiratory: Negative.  Negative for cough, hemoptysis and shortness of breath.   Cardiovascular: Negative.  Negative for chest pain and leg swelling.  Gastrointestinal: Negative.  Negative for abdominal pain, blood in stool and melena.  Genitourinary: Negative.  Negative for hematuria.  Musculoskeletal: Negative.  Negative for back pain.  Skin: Negative.  Negative for rash.  Neurological: Negative.  Negative for dizziness, focal weakness, weakness and headaches.  Psychiatric/Behavioral: Negative.  The patient is not nervous/anxious.     As per HPI. Otherwise, a complete review of systems is negative.  PAST MEDICAL HISTORY: Past Medical History:  Diagnosis Date   Diabetes mellitus without complication (HCC)     PAST SURGICAL HISTORY: Past Surgical History:  Procedure Laterality Date   FOOT SURGERY     TONSILLECTOMY      FAMILY  HISTORY: History reviewed. No pertinent family history.  ADVANCED DIRECTIVES (Y/N):  N  HEALTH MAINTENANCE: Social History   Tobacco Use   Smoking status: Never    Passive exposure: Current   Smokeless tobacco: Never  Vaping Use   Vaping status: Never Used  Substance Use Topics   Alcohol use: Not Currently   Drug use: Never     Colonoscopy:  PAP:  Bone density:  Lipid panel:  No Known Allergies  Current Outpatient Medications  Medication Sig Dispense Refill   dicyclomine (BENTYL) 20 MG tablet Take 1 tablet (20 mg total) by mouth 2 (two) times daily as needed for spasms. 20 tablet 0   glucagon 1 MG injection Inject 1 mg into the muscle as needed.     insulin aspart (NOVOLOG FLEXPEN) 100 UNIT/ML FlexPen Inject 20 Units into the skin 3 (three) times daily before meals. Patient taking 20 units before breakfast and lunch, 30 units before supper     metFORMIN (GLUCOPHAGE-XR) 500 MG 24 hr tablet Take by mouth.     norelgestromin-ethinyl estradiol Burr Medico) 150-35 MCG/24HR transdermal patch Place 1 patch onto the skin once a week.     Vitamin D, Ergocalciferol, (DRISDOL) 1.25 MG (50000 UT) CAPS capsule Take 50,000 Units by mouth once a week.     methocarbamol (ROBAXIN) 500 MG tablet Take 1 tablet (500 mg total) by mouth 2 (two) times daily. (Patient not taking: Reported on 07/07/2023) 20 tablet 0   ondansetron (ZOFRAN) 4 MG tablet Take 1 tablet (4 mg total) by mouth every 6 (six) hours as needed for nausea or vomiting. (Patient not taking: Reported on 07/07/2023) 12 tablet 0   No current facility-administered medications for  this visit.    OBJECTIVE: Vitals:   07/07/23 1319  BP: 127/73  Pulse: (!) 101  Resp: 18  Temp: (!) 97.5 F (36.4 C)  SpO2: 99%     Body mass index is 41.63 kg/m.    ECOG FS:0 - Asymptomatic  General: Well-developed, well-nourished, no acute distress. Eyes: Pink conjunctiva, anicteric sclera. HEENT: Normocephalic, moist mucous membranes. Lungs: No  audible wheezing or coughing. Heart: Regular rate and rhythm. Abdomen: Soft, nontender, no obvious distention. Musculoskeletal: No edema, cyanosis, or clubbing. Neuro: Alert, answering all questions appropriately. Cranial nerves grossly intact. Skin: No rashes or petechiae noted. Psych: Normal affect.  LAB RESULTS:  Lab Results  Component Value Date   NA 136 11/13/2022   K 4.2 11/13/2022   CL 102 11/13/2022   CO2 23 11/13/2022   GLUCOSE 271 (H) 11/13/2022   BUN 11 11/13/2022   CREATININE 0.83 11/13/2022   CALCIUM 9.4 11/13/2022   PROT 7.9 11/13/2022   ALBUMIN 3.6 11/13/2022   AST 18 11/13/2022   ALT 12 11/13/2022   ALKPHOS 94 11/13/2022   BILITOT 0.3 11/13/2022   GFRNONAA >60 11/13/2022    Lab Results  Component Value Date   WBC 7.2 07/06/2023   NEUTROABS 4.0 07/06/2023   HGB 12.7 07/06/2023   HCT 40.1 07/06/2023   MCV 82.5 07/06/2023   PLT 402 (H) 07/06/2023   Lab Results  Component Value Date   IRON 74 07/06/2023   TIBC 500 (H) 07/06/2023   IRONPCTSAT 15 07/06/2023    Lab Results  Component Value Date   FERRITIN 101 07/06/2023     STUDIES: No results found.  ASSESSMENT: Iron deficiency anemia.  PLAN:    Iron deficiency anemia: Resolved.  Likely secondary to menses.  By report patient had a colonoscopy on May 25, 2023 but we do not have these results.  Hemoglobin and iron stores are now within normal limits.  All of her laboratory work was also either negative or within normal limits.  She does not require additional IV Venofer today.  Patient last received treatment on March 27, 2023.  Return to clinic in 4 months with repeat laboratory, further evaluation, consideration of additional treatment if needed.  Thrombocytosis: Improved.  Secondary to iron deficiency.  Monitor.  I spent a total of 20 minutes reviewing chart data, face-to-face evaluation with the patient, counseling and coordination of care as detailed above.    Patient expressed  understanding and was in agreement with this plan. She also understands that She can call clinic at any time with any questions, concerns, or complaints.    Jeralyn Ruths, MD   07/07/2023 3:14 PM

## 2023-08-04 ENCOUNTER — Encounter: Payer: Self-pay | Admitting: *Deleted

## 2023-08-11 ENCOUNTER — Encounter
Admission: RE | Disposition: A | Discharge status: Home / Self Care | Payer: Self-pay | Source: Home / Self Care | Attending: Gastroenterology

## 2023-08-11 ENCOUNTER — Ambulatory Visit: Payer: Medicaid Other | Admitting: Certified Registered"

## 2023-08-11 ENCOUNTER — Encounter: Payer: Self-pay | Admitting: *Deleted

## 2023-08-11 ENCOUNTER — Ambulatory Visit
Admission: RE | Admit: 2023-08-11 | Discharge: 2023-08-11 | Disposition: A | Discharge status: Home / Self Care | Payer: Medicaid Other | Attending: Gastroenterology | Admitting: Gastroenterology

## 2023-08-11 DIAGNOSIS — E119 Type 2 diabetes mellitus without complications: Secondary | ICD-10-CM | POA: Insufficient documentation

## 2023-08-11 DIAGNOSIS — Z7984 Long term (current) use of oral hypoglycemic drugs: Secondary | ICD-10-CM | POA: Diagnosis not present

## 2023-08-11 DIAGNOSIS — Z800 Family history of malignant neoplasm of digestive organs: Secondary | ICD-10-CM | POA: Insufficient documentation

## 2023-08-11 DIAGNOSIS — Z794 Long term (current) use of insulin: Secondary | ICD-10-CM | POA: Diagnosis not present

## 2023-08-11 DIAGNOSIS — K640 First degree hemorrhoids: Secondary | ICD-10-CM | POA: Diagnosis not present

## 2023-08-11 DIAGNOSIS — D509 Iron deficiency anemia, unspecified: Secondary | ICD-10-CM | POA: Diagnosis present

## 2023-08-11 HISTORY — PX: COLONOSCOPY WITH PROPOFOL: SHX5780

## 2023-08-11 HISTORY — PX: ESOPHAGOGASTRODUODENOSCOPY (EGD) WITH PROPOFOL: SHX5813

## 2023-08-11 LAB — GLUCOSE, CAPILLARY: Glucose-Capillary: 109 mg/dL — ABNORMAL HIGH (ref 70–99)

## 2023-08-11 LAB — POCT PREGNANCY, URINE: Preg Test, Ur: NEGATIVE

## 2023-08-11 SURGERY — COLONOSCOPY WITH PROPOFOL
Anesthesia: General

## 2023-08-11 MED ORDER — ESMOLOL HCL 100 MG/10ML IV SOLN
INTRAVENOUS | Status: DC | PRN
  Administered 2023-08-11: 10 mg via INTRAVENOUS

## 2023-08-11 MED ORDER — LIDOCAINE HCL (CARDIAC) PF 100 MG/5ML IV SOSY
PREFILLED_SYRINGE | INTRAVENOUS | Status: DC | PRN
  Administered 2023-08-11: 100 mg via INTRAVENOUS

## 2023-08-11 MED ORDER — MIDAZOLAM HCL 2 MG/2ML IJ SOLN
INTRAMUSCULAR | Status: DC | PRN
  Administered 2023-08-11: 2 mg via INTRAVENOUS

## 2023-08-11 MED ORDER — PROPOFOL 1000 MG/100ML IV EMUL
INTRAVENOUS | Status: AC
  Filled 2023-08-11: qty 400

## 2023-08-11 MED ORDER — PHENYLEPHRINE 80 MCG/ML (10ML) SYRINGE FOR IV PUSH (FOR BLOOD PRESSURE SUPPORT)
PREFILLED_SYRINGE | INTRAVENOUS | Status: DC | PRN
  Administered 2023-08-11: 160 ug via INTRAVENOUS

## 2023-08-11 MED ORDER — MIDAZOLAM HCL 2 MG/2ML IJ SOLN
INTRAMUSCULAR | Status: AC
  Filled 2023-08-11: qty 2

## 2023-08-11 MED ORDER — SODIUM CHLORIDE 0.9 % IV SOLN: INTRAVENOUS | Status: DC

## 2023-08-11 MED ORDER — PROPOFOL 500 MG/50ML IV EMUL
INTRAVENOUS | Status: DC | PRN
  Administered 2023-08-11: 165 ug/kg/min via INTRAVENOUS

## 2023-08-11 MED ORDER — PROPOFOL 10 MG/ML IV BOLUS
INTRAVENOUS | Status: DC | PRN
  Administered 2023-08-11: 30 mg via INTRAVENOUS
  Administered 2023-08-11: 70 mg via INTRAVENOUS
  Administered 2023-08-11 (×2): 50 mg via INTRAVENOUS

## 2023-08-11 MED ORDER — GLYCOPYRROLATE 0.2 MG/ML IJ SOLN
INTRAMUSCULAR | Status: DC | PRN
  Administered 2023-08-11: .2 mg via INTRAVENOUS

## 2023-08-11 NOTE — Op Note (Signed)
[facility] Patient Name: Donna Mcguire Procedure Date: 08/11/2023 7:17 AM MRN: [id number] Account #: [id number] Date of Birth: 2001/05/23 Admit Type: Outpatient Age: 22 Room: [facility] ENDO ROOM 1 Gender: Female Note Status: Finalized Instrument Name: Upper Endoscope [id number] Procedure:             Upper GI endoscopy Indications:           Iron deficiency anemia Providers:             Eather Colas MD, MD Referring MD:          No Local Md, MD (Referring MD) Medicines:             Monitored Anesthesia Care Complications:         No immediate complications. Procedure:             Pre-Anesthesia Assessment:                        - Prior to the procedure, a History and Physical was                         performed, and patient medications and allergies were                         reviewed. The patient is competent. The risks and                         benefits of the procedure and the sedation options and                         risks were discussed with the patient. All questions                         were answered and informed consent was obtained.                         Patient identification and proposed procedure were                         verified by the physician, the nurse, the                         anesthesiologist, the anesthetist and the technician                         in the endoscopy suite. Mental Status Examination:                         alert and oriented. Airway Examination: normal                         oropharyngeal airway and neck mobility. Respiratory                         Examination: clear to auscultation. CV Examination:                         normal. Prophylactic Antibiotics: The patient does not  require prophylactic antibiotics. Prior                         Anticoagulants: The patient has taken no anticoagulant                         or antiplatelet agents. ASA Grade Assessment:  III - A                         patient with severe systemic disease. After reviewing                         the risks and benefits, the patient was deemed in                         satisfactory condition to undergo the procedure. The                         anesthesia plan was to use monitored anesthesia care                         (MAC). Immediately prior to administration of                         medications, the patient was re-assessed for adequacy                         to receive sedatives. The heart rate, respiratory                         rate, oxygen saturations, blood pressure, adequacy of                         pulmonary ventilation, and response to care were                         monitored throughout the procedure. The physical                         status of the patient was re-assessed after the                         procedure.                        After obtaining informed consent, the endoscope was                         passed under direct vision. Throughout the procedure,                         the patient's blood pressure, pulse, and oxygen                         saturations were monitored continuously. The Endoscope                         was introduced through the mouth, and advanced to the  second part of duodenum. The upper GI endoscopy was                         accomplished without difficulty. The patient tolerated                         the procedure well. Findings:      The examined esophagus was normal.      The entire examined stomach was normal.      The examined duodenum was normal. Impression:            - Normal esophagus.                        - Normal stomach.                        - Normal examined duodenum.                        - No specimens collected. Recommendation:        - Discharge patient to home.                        - Resume previous diet.                        - Continue present  medications.                        - Return to referring physician as previously                         scheduled. Procedure Code(s):     --- Professional ---                        [phone], Esophagogastroduodenoscopy, flexible,                         transoral; diagnostic, including collection of                         specimen(s) by brushing or washing, when performed                         (separate procedure) Diagnosis Code(s):     --- Professional ---                        D50.9, Iron deficiency anemia, unspecified CPT copyright 2022 American Medical Association. All rights reserved. The codes documented in this report are preliminary and upon coder review may  be revised to meet current compliance requirements. Eather Colas MD, MD 08/11/2023 8:14:51 AM Number of Addenda: 0 Note Initiated On: 08/11/2023 7:17 AM Estimated Blood Loss:  Estimated blood loss: none.      [facility]

## 2023-08-11 NOTE — H&P (Signed)
Outpatient short stay form Pre-procedure 08/11/2023  Regis Bill, MD  Primary Physician: [facility]  Reason for visit:  IDA  History of present illness:    22 y/o lady with history of obesity and DM II here for EGD/Colonoscopy for IDA. She does have heavy menstrual cycles. No blood thinners. No significant abdominal surgeries. Had an aunt with esophageal cancer.    Current Facility-Administered Medications:    0.9 %  sodium chloride infusion, , Intravenous, Continuous, Lucila Klecka, Rossie Muskrat, MD, Last Rate: 20 mL/hr at 08/11/23 0740, Continued from Pre-op at 08/11/23 0740  Medications Prior to Admission  Medication Sig Dispense Refill Last Dose/Taking   insulin aspart (NOVOLOG FLEXPEN) 100 UNIT/ML FlexPen Inject 20 Units into the skin 3 (three) times daily before meals. Patient taking 20 units before breakfast and lunch, 30 units before supper   08/11/2023 Morning   metFORMIN (GLUCOPHAGE-XR) 500 MG 24 hr tablet Take by mouth.   Past Week   norelgestromin-ethinyl estradiol Burr Medico) 150-35 MCG/24HR transdermal patch Place 1 patch onto the skin once a week.   Past Week   Vitamin D, Ergocalciferol, (DRISDOL) 1.25 MG (50000 UT) CAPS capsule Take 50,000 Units by mouth once a week.   Past Week   dicyclomine (BENTYL) 20 MG tablet Take 1 tablet (20 mg total) by mouth 2 (two) times daily as needed for spasms. 20 tablet 0    glucagon 1 MG injection Inject 1 mg into the muscle as needed.      methocarbamol (ROBAXIN) 500 MG tablet Take 1 tablet (500 mg total) by mouth 2 (two) times daily. (Patient not taking: Reported on 07/07/2023) 20 tablet 0    ondansetron (ZOFRAN) 4 MG tablet Take 1 tablet (4 mg total) by mouth every 6 (six) hours as needed for nausea or vomiting. (Patient not taking: Reported on 07/07/2023) 12 tablet 0      No Known Allergies   Past Medical History:  Diagnosis Date   Diabetes mellitus without complication (HCC)     Review of systems:  Otherwise  negative.    Physical Exam  Gen: Alert, oriented. Appears stated age.  HEENT: PERRLA. Lungs: No respiratory distress CV: RRR Abd: soft, benign, no masses Ext: No edema    Planned procedures: Proceed with EGD/colonoscopy. The patient understands the nature of the planned procedure, indications, risks, alternatives and potential complications including but not limited to bleeding, infection, perforation, damage to internal organs and possible oversedation/side effects from anesthesia. The patient agrees and gives consent to proceed.  Please refer to procedure notes for findings, recommendations and patient disposition/instructions.     Regis Bill, MD [facility]

## 2023-08-11 NOTE — Anesthesia Procedure Notes (Signed)
Procedure Name: General with mask airway Date/Time: 08/11/2023 7:50 AM  Performed by: Mohammed Kindle, CRNAPre-anesthesia Checklist: Patient identified, Emergency Drugs available, Suction available and Patient being monitored Patient Re-evaluated:Patient Re-evaluated prior to induction Oxygen Delivery Method: Simple face mask Induction Type: IV induction Placement Confirmation: positive ETCO2, CO2 detector and breath sounds checked- equal and bilateral Dental Injury: Teeth and Oropharynx as per pre-operative assessment

## 2023-08-11 NOTE — Op Note (Addendum)
[facility] Patient Name: Donna Mcguire Procedure Date: 08/11/2023 7:16 AM MRN: [id number] Account #: [id number] Date of Birth: 2001-05-24 Admit Type: Outpatient Age: 22 Room: [facility] ENDO ROOM 1 Gender: Female Note Status: Supervisor Override Instrument Name: Prentice Docker [id number] Procedure:             Colonoscopy Indications:           Iron deficiency anemia, Change in bowel habits Providers:             Eather Colas MD, MD Referring MD:          No Local Md, MD (Referring MD) Medicines:             Monitored Anesthesia Care Complications:         No immediate complications. Procedure:             Pre-Anesthesia Assessment:                        - Prior to the procedure, a History and Physical was                         performed, and patient medications and allergies were                         reviewed. The patient is competent. The risks and                         benefits of the procedure and the sedation options and                         risks were discussed with the patient. All questions                         were answered and informed consent was obtained.                         Patient identification and proposed procedure were                         verified by the physician, the nurse, the                         anesthesiologist, the anesthetist and the technician                         in the endoscopy suite. Mental Status Examination:                         alert and oriented. Airway Examination: normal                         oropharyngeal airway and neck mobility. Respiratory                         Examination: clear to auscultation. CV Examination:                         normal. Prophylactic Antibiotics: The patient does not  require prophylactic antibiotics. Prior                         Anticoagulants: The patient has taken no anticoagulant                         or antiplatelet agents.  ASA Grade Assessment: III - A                         patient with severe systemic disease. After reviewing                         the risks and benefits, the patient was deemed in                         satisfactory condition to undergo the procedure. The                         anesthesia plan was to use monitored anesthesia care                         (MAC). Immediately prior to administration of                         medications, the patient was re-assessed for adequacy                         to receive sedatives. The heart rate, respiratory                         rate, oxygen saturations, blood pressure, adequacy of                         pulmonary ventilation, and response to care were                         monitored throughout the procedure. The physical                         status of the patient was re-assessed after the                         procedure.                        After obtaining informed consent, the colonoscope was                         passed under direct vision. Throughout the procedure,                         the patient's blood pressure, pulse, and oxygen                         saturations were monitored continuously. The                         Colonoscope was introduced through the anus and  advanced to the the terminal ileum, with                         identification of the appendiceal orifice and IC                         valve. The colonoscopy was performed without                         difficulty. The patient tolerated the procedure well.                         The quality of the bowel preparation was good. The                         terminal ileum, ileocecal valve, appendiceal orifice,                         and rectum were photographed. Findings:      The perianal and digital rectal examinations were normal.      The terminal ileum appeared normal.      Internal hemorrhoids were found during retroflexion.  The hemorrhoids       were Grade I (internal hemorrhoids that do not prolapse).      The exam was otherwise without abnormality on direct and retroflexion       views. Impression:            - The examined portion of the ileum was normal.                        - Internal hemorrhoids.                        - The examination was otherwise normal on direct and                         retroflexion views.                        - No specimens collected. Recommendation:        - Discharge patient to home.                        - Resume previous diet.                        - Continue present medications.                        - Repeat colonoscopy at age 31 for screening purposes.                        - Return to referring physician as previously                         scheduled. Procedure Code(s):     --- Professional ---                        [phone], Colonoscopy, flexible; diagnostic, including  collection of specimen(s) by brushing or washing, when                         performed (separate procedure) Diagnosis Code(s):     --- Professional ---                        K64.0, First degree hemorrhoids                        D50.9, Iron deficiency anemia, unspecified CPT copyright 2022 American Medical Association. All rights reserved. The codes documented in this report are preliminary and upon coder review may  be revised to meet current compliance requirements. Eather Colas MD, MD 08/11/2023 8:17:41 AM Number of Addenda: 0 Note Initiated On: 08/11/2023 7:16 AM Scope Withdrawal Time: 0 hours 7 minutes 8 seconds  Total Procedure Duration: 0 hours 10 minutes 16 seconds  Estimated Blood Loss:  Estimated blood loss: none.      [facility]

## 2023-08-11 NOTE — Anesthesia Preprocedure Evaluation (Signed)
Anesthesia Evaluation  Patient identified by MRN, date of birth, ID band Patient awake    Reviewed: Allergy & Precautions, H&P , NPO status , Patient's Chart, lab work & pertinent test results, reviewed documented beta blocker date and time   History of Anesthesia Complications (+) Emergence Delirium and history of anesthetic complications  Airway Mallampati: II  TM Distance: >3 FB Neck ROM: full    Dental  (+) Dental Advidsory Given   Pulmonary neg shortness of breath, asthma (as a kid) , neg COPD, neg recent URI   Pulmonary exam normal breath sounds clear to auscultation       Cardiovascular Exercise Tolerance: Good negative cardio ROS Normal cardiovascular exam Rhythm:regular Rate:Normal     Neuro/Psych negative neurological ROS  negative psych ROS   GI/Hepatic negative GI ROS,,,NAFLD   Endo/Other  diabetes  Class 3 obesity  Renal/GU negative Renal ROS  negative genitourinary   Musculoskeletal   Abdominal   Peds  Hematology  (+) Blood dyscrasia, anemia   Anesthesia Other Findings Past Medical History: No date: Diabetes mellitus without complication (HCC)   Reproductive/Obstetrics negative OB ROS                             Anesthesia Physical Anesthesia Plan  ASA: 3  Anesthesia Plan: General   Post-op Pain Management:    Induction: Intravenous  PONV Risk Score and Plan: 3 and Propofol infusion and TIVA  Airway Management Planned: Natural Airway and Nasal Cannula  Additional Equipment:   Intra-op Plan:   Post-operative Plan:   Informed Consent: I have reviewed the patients History and Physical, chart, labs and discussed the procedure including the risks, benefits and alternatives for the proposed anesthesia with the patient or authorized representative who has indicated his/her understanding and acceptance.     Dental Advisory Given  Plan Discussed with:  Anesthesiologist, CRNA and Surgeon  Anesthesia Plan Comments:        Anesthesia Quick Evaluation

## 2023-08-11 NOTE — Transfer of Care (Signed)
Immediate Anesthesia Transfer of Care Note  Patient: Donna Mcguire  Procedure(s) Performed: COLONOSCOPY WITH PROPOFOL ESOPHAGOGASTRODUODENOSCOPY (EGD) WITH PROPOFOL  Patient Location: PACU  Anesthesia Type:General  Level of Consciousness: awake, drowsy, and patient cooperative  Airway & Oxygen Therapy: Patient Spontanous Breathing and Patient connected to face mask oxygen  Post-op Assessment: Report given to RN and Post -op Vital signs reviewed and stable  Post vital signs: Reviewed and stable  Last Vitals:  Vitals Value Taken Time  BP 118/68 08/11/23 0814  Temp    Pulse 100 08/11/23 0816  Resp 23 08/11/23 0816  SpO2 100 % 08/11/23 0816  Vitals shown include unfiled device data.  Last Pain:  Vitals:   08/11/23 0711  TempSrc: Temporal         Complications: No notable events documented.

## 2023-08-11 NOTE — Interval H&P Note (Signed)
History and Physical Interval Note:  08/11/2023 7:47 AM  Donna Mcguire  has presented today for surgery, with the diagnosis of Iron deficiency anemia, unspecified iron deficiency anemia type (D50.9) Irregular bowel habits (R19.8).  The various methods of treatment have been discussed with the patient and family. After consideration of risks, benefits and other options for treatment, the patient has consented to  Procedure(s): COLONOSCOPY WITH PROPOFOL (N/A) ESOPHAGOGASTRODUODENOSCOPY (EGD) WITH PROPOFOL (N/A) as a surgical intervention.  The patient's history has been reviewed, patient examined, no change in status, stable for surgery.  I have reviewed the patient's chart and labs.  Questions were answered to the patient's satisfaction.     Regis Bill  Ok to proceed with EGD/Colonoscopy

## 2023-08-12 ENCOUNTER — Encounter: Payer: Self-pay | Admitting: Gastroenterology

## 2023-08-13 NOTE — Anesthesia Postprocedure Evaluation (Signed)
Anesthesia Post Note  Patient: Donna Mcguire  Procedure(s) Performed: COLONOSCOPY WITH PROPOFOL ESOPHAGOGASTRODUODENOSCOPY (EGD) WITH PROPOFOL  Patient location during evaluation: Endoscopy Anesthesia Type: General Level of consciousness: awake and alert Pain management: pain level controlled Vital Signs Assessment: post-procedure vital signs reviewed and stable Respiratory status: spontaneous breathing, nonlabored ventilation, respiratory function stable and patient connected to nasal cannula oxygen Cardiovascular status: blood pressure returned to baseline and stable Postop Assessment: no apparent nausea or vomiting Anesthetic complications: no   No notable events documented.   Last Vitals:  Vitals:   08/11/23 0820 08/11/23 0834  BP: (!) 106/54 95/72  Pulse: 95 90  Resp: 20 13  Temp:    SpO2: 100% 98%    Last Pain:  Vitals:   08/12/23 0751  TempSrc:   PainSc: 0-No pain                 Lenard Simmer

## 2023-08-14 ENCOUNTER — Encounter: Payer: Medicaid Other | Attending: Family Medicine | Admitting: Dietician

## 2023-08-14 ENCOUNTER — Encounter: Payer: Self-pay | Admitting: Dietician

## 2023-08-14 VITALS — Wt 237.0 lb

## 2023-08-14 DIAGNOSIS — K760 Fatty (change of) liver, not elsewhere classified: Secondary | ICD-10-CM | POA: Diagnosis present

## 2023-08-14 NOTE — Patient Instructions (Signed)
Previous Goals:  Aim for 64 oz water daily. - in progress, continue. Meets goals on some days. Set a reminder on your phone to drink water!  Aim to switch saturated fat sources to unsaturated and omega 3's. - in progress, continue.   Eat 3 times per day (at least 2 meals and a snack, or 3 meals per day). - in progress, continue.  Have 1 vegetable every day! -in progress, continue.   New Goal:  Continue the walking. Start stretching 3 times a week for at least 15 minutes.

## 2023-08-14 NOTE — Progress Notes (Signed)
Medical Nutrition Therapy  Appointment Start time:  67 Appointment End time:  1205  Primary concerns today: Pt is concerned about her diagnosis of NAFLD.    Referral diagnosis: fatty liver Preferred learning style: no preference indicated Learning readiness: ready   NUTRITION ASSESSMENT   Anthropometrics   Wt Readings from Last 3 Encounters:  08/14/23 237 lb (107.5 kg)  08/11/23 238 lb (108 kg)  07/07/23 235 lb (106.6 kg)    Clinical Medical Hx: type 2 diabetes, NAFLD, vitamin D deficiency Medications: insulin aspart (novolog), insulin glargine (lantus), metformin, glucagon Labs: A1c 7.3 05/11/23 Notable Signs/Symptoms: nausea/diarrhea  Food Allergies: tomatoes (stomach pain)  Lifestyle & Dietary Hx  Pt present with partner.   Pt states she has been eating fish once a week. Pt reports she thinks her taste buds are changing and states she doesn't have a taste for sweets anymore. Pt states she reduced her candy intake and now she does not like chocolate anymore.   Pt states she hasn't been consistently eating 3 meals a day. Pt reports she had a colonoscopy a few days ago because she has been experiencing loose stools.   Pt states she thinks she's been drinking less water when she is with her partner. She states it is 'out of sight out of mind'. Pt reports at her living place she will have 3-5 bottles daily, but at his she may have 2-3 glasses.  Pt reports she feels she has been having more fast food.   Estimated daily fluid intake: 32-64 oz Supplements: vitamin D Sleep: 4-10 hours Stress / self-care: moderate-to-high, enjoys crochet Current average weekly physical activity: physical therapy and some walking  24-Hr Dietary Recall First Meal: none Snack: fruit Second Meal: fast food Snack: none OR crackers OR chips Third Meal: home: protein with rice and vegetables OR fast food meat and starch Snack: none Beverages: water, soda or juice 1x/wk.    NUTRITION  DIAGNOSIS  NB-1.1 Food and nutrition-related knowledge deficit As related to lack of prior nutrition education by a nutrition professional.  As evidenced by pt report.   NUTRITION INTERVENTION  Nutrition education (E-1) on the following topics:   Plate Method At meals, aim to include 1/2 plate non-starchy vegetables, 1/4 plate protein, and 1/4 plate complex carbs.  Fruits & Vegetables: Aim to fill half your plate with a variety of fruits and vegetables. They are rich in vitamins, minerals, and fiber, and can help reduce the risk of chronic diseases. Choose a colorful assortment of fruits and vegetables to ensure you get a wide range of nutrients. Grains and Starches: Make at least half of your grain choices whole grains, such as brown rice, whole wheat bread, and oats. Whole grains provide fiber, which aids in digestion and healthy cholesterol levels. Aim for whole forms of starchy vegetables such as potatoes, sweet potatoes, beans, peas, and corn, which are fiber rich and provide many vitamins and minerals.  Protein: Incorporate lean sources of protein, such as poultry, fish, beans, nuts, and seeds, into your meals. Protein is essential for building and repairing tissues, staying full, balancing blood sugar, as well as supporting immune function. Dairy: Include low-fat or fat-free dairy products like milk, yogurt, and cheese in your diet. Dairy foods are excellent sources of calcium and vitamin D, which are crucial for bone health.   Exercise Finding an exercise you enjoy is crucial for maintaining long-term fitness and overall health. Enjoyable activities are more likely to become regular habits, making it easier to stay  consistent with physical activity. When you look forward to your workouts, exercise becomes a positive experience rather than a chore, reducing the likelihood of burnout or quitting. Enjoyable exercise also enhances mental well-being, as engaging in activities you love can boost  mood, reduce stress, and provide a sense of accomplishment.  Reviewed food models, discussed carb counting, portion sizes, and strategies to build a balanced meal.    Handouts Provided Include  No handouts provided on this follow up  Learning Style & Readiness for Change Teaching method utilized: Visual & Auditory  Demonstrated degree of understanding via: Teach Back  Barriers to learning/adherence to lifestyle change: none  Goals Established by Pt  Previous Goals:  Aim for 64 oz water daily. - in progress, continue. Meets goals on some days. Set a reminder on your phone to drink water!  Aim to switch saturated fat sources to unsaturated and omega 3's. - in progress, continue.   Eat 3 times per day (at least 2 meals and a snack, or 3 meals per day). - in progress, continue.  Have 1 vegetable every day! -in progress, continue.   New Goal:  Continue the walking. Start stretching 3 times a week for at least 15 minutes.    MONITORING & EVALUATION Dietary intake, weekly physical activity, and follow up in 3 months.  Next Steps  Patient is to call for questions.

## 2023-11-04 ENCOUNTER — Inpatient Hospital Stay: Payer: Medicaid Other | Attending: Oncology

## 2023-11-04 DIAGNOSIS — R5383 Other fatigue: Secondary | ICD-10-CM | POA: Insufficient documentation

## 2023-11-04 DIAGNOSIS — D75839 Thrombocytosis, unspecified: Secondary | ICD-10-CM | POA: Diagnosis present

## 2023-11-04 DIAGNOSIS — D509 Iron deficiency anemia, unspecified: Secondary | ICD-10-CM

## 2023-11-04 DIAGNOSIS — Z862 Personal history of diseases of the blood and blood-forming organs and certain disorders involving the immune mechanism: Secondary | ICD-10-CM | POA: Diagnosis not present

## 2023-11-04 LAB — CBC WITH DIFFERENTIAL/PLATELET
Abs Immature Granulocytes: 0.02 10*3/uL (ref 0.00–0.07)
Basophils Absolute: 0.1 10*3/uL (ref 0.0–0.1)
Basophils Relative: 1 %
Eosinophils Absolute: 0.3 10*3/uL (ref 0.0–0.5)
Eosinophils Relative: 4 %
HCT: 39.9 % (ref 36.0–46.0)
Hemoglobin: 12.8 g/dL (ref 12.0–15.0)
Immature Granulocytes: 0 %
Lymphocytes Relative: 29 %
Lymphs Abs: 2.2 10*3/uL (ref 0.7–4.0)
MCH: 26.1 pg (ref 26.0–34.0)
MCHC: 32.1 g/dL (ref 30.0–36.0)
MCV: 81.3 fL (ref 80.0–100.0)
Monocytes Absolute: 0.5 10*3/uL (ref 0.1–1.0)
Monocytes Relative: 6 %
Neutro Abs: 4.4 10*3/uL (ref 1.7–7.7)
Neutrophils Relative %: 60 %
Platelets: 415 10*3/uL — ABNORMAL HIGH (ref 150–400)
RBC: 4.91 MIL/uL (ref 3.87–5.11)
RDW: 13 % (ref 11.5–15.5)
WBC: 7.4 10*3/uL (ref 4.0–10.5)
nRBC: 0 % (ref 0.0–0.2)

## 2023-11-04 LAB — IRON AND TIBC
Iron: 78 ug/dL (ref 28–170)
Saturation Ratios: 16 % (ref 10.4–31.8)
TIBC: 482 ug/dL — ABNORMAL HIGH (ref 250–450)
UIBC: 404 ug/dL

## 2023-11-04 LAB — FERRITIN: Ferritin: 67 ng/mL (ref 11–307)

## 2023-11-05 ENCOUNTER — Inpatient Hospital Stay: Payer: Medicaid Other

## 2023-11-05 ENCOUNTER — Encounter: Payer: Self-pay | Admitting: Oncology

## 2023-11-05 ENCOUNTER — Inpatient Hospital Stay (HOSPITAL_BASED_OUTPATIENT_CLINIC_OR_DEPARTMENT_OTHER): Payer: Medicaid Other | Admitting: Oncology

## 2023-11-05 VITALS — BP 124/85 | HR 98 | Temp 98.3°F | Resp 18 | Ht 63.0 in | Wt 248.0 lb

## 2023-11-05 DIAGNOSIS — D75839 Thrombocytosis, unspecified: Secondary | ICD-10-CM | POA: Diagnosis not present

## 2023-11-05 DIAGNOSIS — D509 Iron deficiency anemia, unspecified: Secondary | ICD-10-CM | POA: Diagnosis not present

## 2023-11-05 NOTE — Progress Notes (Unsigned)
 [facility]  Telephone:([phone] [phone]  ID: Donna Mcguire OB: 04-Sep-2000  MR#: [id number]  DGL#:[id number]  Patient Care Team: [facility] as PCP - General Orlie Dakin Tollie Pizza, MD as Consulting Physician (Oncology)  CHIEF COMPLAINT: Iron deficiency anemia.  INTERVAL HISTORY: Patient returns to clinic today for repeat laboratory work, further evaluation, consideration of additional IV Venofer.  Patient states after her last treatment she initially felt well, but currently feels increased fatigue.  She has no neurologic complaints.  She has a good appetite and denies weight loss.  She denies any recent fevers or illnesses.  She has no chest pain, shortness of breath, cough, or hemoptysis.  She denies any nausea, vomiting, constipation, or diarrhea.  She has no melena or hematochezia.  She has no urinary complaints.  Patient offers no further specific complaints today.  REVIEW OF SYSTEMS:   Review of Systems  Constitutional:  Positive for malaise/fatigue. Negative for fever and weight loss.  Respiratory: Negative.  Negative for cough, hemoptysis and shortness of breath.   Cardiovascular: Negative.  Negative for chest pain and leg swelling.  Gastrointestinal: Negative.  Negative for abdominal pain, blood in stool and melena.  Genitourinary: Negative.  Negative for hematuria.  Musculoskeletal: Negative.  Negative for back pain.  Skin: Negative.  Negative for rash.  Neurological: Negative.  Negative for dizziness, focal weakness, weakness and headaches.  Psychiatric/Behavioral: Negative.  The patient is not nervous/anxious.     As per HPI. Otherwise, a complete review of systems is negative.  PAST MEDICAL HISTORY: Past Medical History:  Diagnosis Date  . Diabetes mellitus without complication (HCC)     PAST SURGICAL HISTORY: Past Surgical History:  Procedure Laterality Date  . COLONOSCOPY WITH PROPOFOL N/A 08/11/2023   Procedure:  COLONOSCOPY WITH PROPOFOL;  Surgeon: Regis Bill, MD;  Location: [facility] ENDOSCOPY;  Service: Endoscopy;  Laterality: N/A;  . ESOPHAGOGASTRODUODENOSCOPY (EGD) WITH PROPOFOL N/A 08/11/2023   Procedure: ESOPHAGOGASTRODUODENOSCOPY (EGD) WITH PROPOFOL;  Surgeon: Regis Bill, MD;  Location: [facility] ENDOSCOPY;  Service: Endoscopy;  Laterality: N/A;  . FOOT SURGERY    . TONSILLECTOMY      FAMILY HISTORY: History reviewed. No pertinent family history.  ADVANCED DIRECTIVES (Y/N):  N  HEALTH MAINTENANCE: Social History   Tobacco Use  . Smoking status: Never    Passive exposure: Current  . Smokeless tobacco: Never  Vaping Use  . Vaping status: Never Used  Substance Use Topics  . Alcohol use: Not Currently  . Drug use: Never     Colonoscopy:  PAP:  Bone density:  Lipid panel:  No Known Allergies  Current Outpatient Medications  Medication Sig Dispense Refill  . dicyclomine (BENTYL) 20 MG tablet Take 1 tablet (20 mg total) by mouth 2 (two) times daily as needed for spasms. 20 tablet 0  . glucagon 1 MG injection Inject 1 mg into the muscle as needed.    . insulin aspart (NOVOLOG FLEXPEN) 100 UNIT/ML FlexPen Inject 20 Units into the skin 3 (three) times daily before meals. Patient taking 20 units before breakfast and lunch, 30 units before supper    . metFORMIN (GLUCOPHAGE-XR) 500 MG 24 hr tablet Take by mouth.    . norelgestromin-ethinyl estradiol Burr Medico) 150-35 MCG/24HR transdermal patch Place 1 patch onto the skin once a week.    . Vitamin D, Ergocalciferol, (DRISDOL) 1.25 MG (50000 UT) CAPS capsule Take 50,000 Units by mouth once a week.    . methocarbamol (ROBAXIN) 500 MG tablet Take 1  tablet (500 mg total) by mouth 2 (two) times daily. (Patient not taking: Reported on 11/05/2023) 20 tablet 0  . ondansetron (ZOFRAN) 4 MG tablet Take 1 tablet (4 mg total) by mouth every 6 (six) hours as needed for nausea or vomiting. (Patient not taking: Reported on 11/05/2023) 12 tablet 0    No current facility-administered medications for this visit.    OBJECTIVE: Vitals:   11/05/23 1427  BP: 124/85  Pulse: 98  Resp: 18  Temp: 98.3 F (36.8 C)  SpO2: 99%     Body mass index is 43.93 kg/m.    ECOG FS:0 - Asymptomatic  General: Well-developed, well-nourished, no acute distress. Eyes: Pink conjunctiva, anicteric sclera. HEENT: Normocephalic, moist mucous membranes. Lungs: No audible wheezing or coughing. Heart: Regular rate and rhythm. Abdomen: Soft, nontender, no obvious distention. Musculoskeletal: No edema, cyanosis, or clubbing. Neuro: Alert, answering all questions appropriately. Cranial nerves grossly intact. Skin: No rashes or petechiae noted. Psych: Normal affect.  LAB RESULTS:  Lab Results  Component Value Date   NA 136 11/13/2022   K 4.2 11/13/2022   CL 102 11/13/2022   CO2 23 11/13/2022   GLUCOSE 271 (H) 11/13/2022   BUN 11 11/13/2022   CREATININE 0.83 11/13/2022   CALCIUM 9.4 11/13/2022   PROT 7.9 11/13/2022   ALBUMIN 3.6 11/13/2022   AST 18 11/13/2022   ALT 12 11/13/2022   ALKPHOS 94 11/13/2022   BILITOT 0.3 11/13/2022   GFRNONAA >60 11/13/2022    Lab Results  Component Value Date   WBC 7.4 11/04/2023   NEUTROABS 4.4 11/04/2023   HGB 12.8 11/04/2023   HCT 39.9 11/04/2023   MCV 81.3 11/04/2023   PLT 415 (H) 11/04/2023   Lab Results  Component Value Date   IRON 78 11/04/2023   TIBC 482 (H) 11/04/2023   IRONPCTSAT 16 11/04/2023    Lab Results  Component Value Date   FERRITIN 67 11/04/2023     STUDIES: No results found.  ASSESSMENT: Iron deficiency anemia.  PLAN:    Iron deficiency anemia: Resolved.  Likely secondary to menses.  By report patient had a colonoscopy on May 25, 2023 but we do not have these results.  Hemoglobin and iron stores are now within normal limits.  All of her laboratory work was also either negative or within normal limits.  She does not require additional IV Venofer today.  Patient last  received treatment on March 27, 2023.  Return to clinic in 4 months with repeat laboratory, further evaluation, consideration of additional treatment if needed.  Thrombocytosis: Improved.  Secondary to iron deficiency.  Monitor.  I spent a total of 20 minutes reviewing chart data, face-to-face evaluation with the patient, counseling and coordination of care as detailed above.    Patient expressed understanding and was in agreement with this plan. She also understands that She can call clinic at any time with any questions, concerns, or complaints.    Jeralyn Ruths, MD   11/05/2023 2:56 PM

## 2023-11-05 NOTE — Progress Notes (Unsigned)
 Mentioned having headaches in the mornings for the last 2 months.

## 2023-11-06 ENCOUNTER — Encounter: Payer: Self-pay | Admitting: Oncology

## 2023-11-12 ENCOUNTER — Ambulatory Visit: Payer: Medicaid Other | Admitting: Dietician

## 2023-11-19 ENCOUNTER — Ambulatory Visit: Admitting: Dietician

## 2023-11-26 ENCOUNTER — Encounter: Payer: Self-pay | Admitting: Dietician

## 2023-11-26 ENCOUNTER — Encounter: Attending: Gastroenterology | Admitting: Dietician

## 2023-11-26 VITALS — Wt 250.0 lb

## 2023-11-26 DIAGNOSIS — E669 Obesity, unspecified: Secondary | ICD-10-CM | POA: Insufficient documentation

## 2023-11-26 NOTE — Progress Notes (Signed)
 Medical Nutrition Therapy  Appointment Start time:  1630 Appointment End time:  1655  Referral diagnosis: E66.9 Preferred learning style: no preference indicated Learning readiness: ready   NUTRITION ASSESSMENT   Anthropometrics   Wt Readings from Last 3 Encounters:  11/26/23 250 lb (113.4 kg)  11/05/23 248 lb (112.5 kg)  08/14/23 237 lb (107.5 kg)    Clinical Medical Hx: type 2 diabetes, NAFLD, vitamin D deficiency Medications: insulin aspart (novolog), insulin glargine (lantus), metformin, glucagon Labs: A1c 7.5% 11/24/23 Notable Signs/Symptoms: none reported Food Allergies: tomatoes (stomach pain)  Lifestyle & Dietary Hx  Pt reports she has been working on school and is almost done.  Pt states her sleep schedule has been disrupted with some days being 12am-7am, and some 3am-8am.  Pt reports she has been eating more greens. Pt states when she was at her boyfriends she would eat more fast food but he has been gone helping take care of his dad so she has been eating more meals from home. Pt states she has also been drinking more water averaging around 64 oz per day.  Pt states she walks 30 min 2 times per week but has not been stretching.  Estimated daily fluid intake: 64 oz Supplements: vitamin D Sleep: 5-7 hours Stress / self-care: moderate-to-high, enjoys crochet Current average weekly physical activity: physical therapy and some walking  24-Hr Dietary Recall First Meal: 11am: matcha tea and chicken panini sandwich Snack: none Second Meal: none Snack: caramels Third Meal: home: chicken and rice and vegetables OR pizza Snack: none Beverages: water, matcha tea, soda 1-2 times per week.   NUTRITION DIAGNOSIS  NB-1.1 Food and nutrition-related knowledge deficit As related to lack of prior nutrition education by a nutrition professional.  As evidenced by pt report.   NUTRITION INTERVENTION  Nutrition education (E-1) on the following topics:   Plate Method At  meals, aim to include 1/2 plate non-starchy vegetables, 1/4 plate protein, and 1/4 plate complex carbs.  Fruits & Vegetables: Aim to fill half your plate with a variety of fruits and vegetables. They are rich in vitamins, minerals, and fiber, and can help reduce the risk of chronic diseases. Choose a colorful assortment of fruits and vegetables to ensure you get a wide range of nutrients. Grains and Starches: Make at least half of your grain choices whole grains, such as brown rice, whole wheat bread, and oats. Whole grains provide fiber, which aids in digestion and healthy cholesterol levels. Aim for whole forms of starchy vegetables such as potatoes, sweet potatoes, beans, peas, and corn, which are fiber rich and provide many vitamins and minerals.  Protein: Incorporate lean sources of protein, such as poultry, fish, beans, nuts, and seeds, into your meals. Protein is essential for building and repairing tissues, staying full, balancing blood sugar, as well as supporting immune function. Dairy: Include low-fat or fat-free dairy products like milk, yogurt, and cheese in your diet. Dairy foods are excellent sources of calcium and vitamin D, which are crucial for bone health.   Exercise Finding an exercise you enjoy is crucial for maintaining long-term fitness and overall health. Enjoyable activities are more likely to become regular habits, making it easier to stay consistent with physical activity. When you look forward to your workouts, exercise becomes a positive experience rather than a chore, reducing the likelihood of burnout or quitting. Enjoyable exercise also enhances mental well-being, as engaging in activities you love can boost mood, reduce stress, and provide a sense of accomplishment.  Reviewed food  models, discussed carb counting, portion sizes, and strategies to build a balanced meal.   Vitamin D Repletion:  Sunlight Exposure: Spend time outdoors: Exposure to sunlight is a natural way  for the body to produce vitamin D. Aim for around 10-30 minutes of sun exposure on the face, arms, and legs, at least twice a week. Time of day: Sun exposure is most effective when the sun is high in the sky (between 10 am and 3 pm).  Dietary Sources: Fatty fish: Include salmon, mackerel, and sardines in your diet. Fortified foods: Consume vitamin D-fortified foods such as fortified milk, orange juice, and cereals. Eggs: Eat egg yolks, which naturally contain vitamin D. Supplements: vitamin D  Regular Monitoring: Blood tests: Periodically monitor vitamin D levels through blood tests to assess progress and adjust supplementation if necessary.   Handouts Provided Include  American Heart Association Sleep Handout  Learning Style & Readiness for Change Teaching method utilized: Visual & Auditory  Demonstrated degree of understanding via: Teach Back  Barriers to learning/adherence to lifestyle change: none  Goals Established by Pt  New Goals:   Walk 3 days per week for 30 minutes.   Create a bedtime routine.   Previous Goals:  Aim for 64 oz water daily. - in progress, continue. Meets goals on some days. Set a reminder on your phone to drink water!  Aim to switch saturated fat sources to unsaturated and omega 3's. - in progress, continue.   Eat 3 times per day (at least 2 meals and a snack, or 3 meals per day). - in progress, continue.  Have 1 vegetable every day! -in progress, continue.   Continue the walking. Start stretching 3 times a week for at least 15 minutes. - goal not met.    MONITORING & EVALUATION Dietary intake, weekly physical activity, and follow up in 3 months.  Next Steps  Patient is to call for questions.

## 2023-11-26 NOTE — Patient Instructions (Addendum)
 New Goals:   Walk 3 days per week for 30 minutes.   Create a bedtime routine.   Vitamin D Repletion:  Sunlight Exposure: Spend time outdoors: Exposure to sunlight is a natural way for the body to produce vitamin D. Aim for around 10-30 minutes of sun exposure on the face, arms, and legs, at least twice a week. Time of day: Sun exposure is most effective when the sun is high in the sky (between 10 am and 3 pm).  Dietary Sources: Fatty fish: Include salmon, mackerel, and sardines in your diet. Fortified foods: Consume vitamin D-fortified foods such as fortified milk, orange juice, and cereals. Eggs: Eat egg yolks, which naturally contain vitamin D. Supplements: vitamin D  Regular Monitoring: Blood tests: Periodically monitor vitamin D levels through blood tests to assess progress and adjust supplementation if necessary.

## 2024-02-25 ENCOUNTER — Ambulatory Visit: Admitting: Dietician

## 2024-05-06 ENCOUNTER — Inpatient Hospital Stay: Attending: Oncology

## 2024-05-06 ENCOUNTER — Encounter: Payer: Self-pay | Admitting: *Deleted

## 2024-05-06 DIAGNOSIS — N920 Excessive and frequent menstruation with regular cycle: Secondary | ICD-10-CM | POA: Diagnosis not present

## 2024-05-06 DIAGNOSIS — D75839 Thrombocytosis, unspecified: Secondary | ICD-10-CM | POA: Diagnosis not present

## 2024-05-06 DIAGNOSIS — D509 Iron deficiency anemia, unspecified: Secondary | ICD-10-CM | POA: Insufficient documentation

## 2024-05-06 LAB — CBC WITH DIFFERENTIAL/PLATELET
Abs Immature Granulocytes: 0.02 K/uL (ref 0.00–0.07)
Basophils Absolute: 0.1 K/uL (ref 0.0–0.1)
Basophils Relative: 1 %
Eosinophils Absolute: 0.2 K/uL (ref 0.0–0.5)
Eosinophils Relative: 3 %
HCT: 37.7 % (ref 36.0–46.0)
Hemoglobin: 11.8 g/dL — ABNORMAL LOW (ref 12.0–15.0)
Immature Granulocytes: 0 %
Lymphocytes Relative: 33 %
Lymphs Abs: 2.4 K/uL (ref 0.7–4.0)
MCH: 25.1 pg — ABNORMAL LOW (ref 26.0–34.0)
MCHC: 31.3 g/dL (ref 30.0–36.0)
MCV: 80.2 fL (ref 80.0–100.0)
Monocytes Absolute: 0.6 K/uL (ref 0.1–1.0)
Monocytes Relative: 8 %
Neutro Abs: 3.9 K/uL (ref 1.7–7.7)
Neutrophils Relative %: 55 %
Platelets: 473 K/uL — ABNORMAL HIGH (ref 150–400)
RBC: 4.7 MIL/uL (ref 3.87–5.11)
RDW: 12.9 % (ref 11.5–15.5)
WBC: 7.1 K/uL (ref 4.0–10.5)
nRBC: 0 % (ref 0.0–0.2)

## 2024-05-06 LAB — IRON AND TIBC
Iron: 43 ug/dL (ref 28–170)
Saturation Ratios: 12 % (ref 10.4–31.8)
TIBC: 365 ug/dL (ref 250–450)
UIBC: 322 ug/dL

## 2024-05-06 LAB — FERRITIN: Ferritin: 32 ng/mL (ref 11–307)

## 2024-05-09 ENCOUNTER — Inpatient Hospital Stay

## 2024-05-09 ENCOUNTER — Inpatient Hospital Stay (HOSPITAL_BASED_OUTPATIENT_CLINIC_OR_DEPARTMENT_OTHER): Admitting: Oncology

## 2024-05-09 ENCOUNTER — Encounter: Payer: Self-pay | Admitting: Oncology

## 2024-05-09 VITALS — BP 123/82 | HR 84 | Temp 98.7°F | Resp 16 | Wt 252.0 lb

## 2024-05-09 DIAGNOSIS — D509 Iron deficiency anemia, unspecified: Secondary | ICD-10-CM

## 2024-05-09 NOTE — Progress Notes (Signed)
 [facility]  [phone] [phone]  ID: Donna Mcguire OB: 06-20-2001  MR#: [id number]  RDW#:[id number]  Patient Care Team: [facility] as PCP - General Jacobo Evalene PARAS, MD as Consulting Physician (Oncology)  CHIEF COMPLAINT: Iron  deficiency anemia.  INTERVAL HISTORY: Patient returns to clinic today for repeat laboratory, further evaluation, and consideration of additional IV Venofer .  She currently feels well and is asymptomatic.  She does not complain of any weakness or fatigue today.  She has no neurologic complaints.  She has a good appetite and denies weight loss.  She denies any recent fevers or illnesses.  She has no chest pain, shortness of breath, cough, or hemoptysis.  She denies any nausea, vomiting, constipation, or diarrhea.  She has no melena or hematochezia.  She has no urinary complaints.  Patient offers no specific complaints today.  REVIEW OF SYSTEMS:   Review of Systems  Constitutional: Negative.  Negative for fever, malaise/fatigue and weight loss.  Respiratory: Negative.  Negative for cough, hemoptysis and shortness of breath.   Cardiovascular: Negative.  Negative for chest pain and leg swelling.  Gastrointestinal: Negative.  Negative for abdominal pain, blood in stool and melena.  Genitourinary: Negative.  Negative for hematuria.  Musculoskeletal: Negative.  Negative for back pain.  Skin: Negative.  Negative for rash.  Neurological: Negative.  Negative for dizziness, focal weakness, weakness and headaches.  Psychiatric/Behavioral: Negative.  The patient is not nervous/anxious.     As per HPI. Otherwise, a complete review of systems is negative.  PAST MEDICAL HISTORY: Past Medical History:  Diagnosis Date   Diabetes mellitus without complication (HCC)     PAST SURGICAL HISTORY: Past Surgical History:  Procedure Laterality Date   COLONOSCOPY WITH PROPOFOL  N/A 08/11/2023   Procedure: COLONOSCOPY WITH  PROPOFOL ;  Surgeon: Maryruth Ole DASEN, MD;  Location: [facility] ENDOSCOPY;  Service: Endoscopy;  Laterality: N/A;   ESOPHAGOGASTRODUODENOSCOPY (EGD) WITH PROPOFOL  N/A 08/11/2023   Procedure: ESOPHAGOGASTRODUODENOSCOPY (EGD) WITH PROPOFOL ;  Surgeon: Maryruth Ole DASEN, MD;  Location: [facility] ENDOSCOPY;  Service: Endoscopy;  Laterality: N/A;   FOOT SURGERY     TONSILLECTOMY      FAMILY HISTORY: History reviewed. No pertinent family history.  ADVANCED DIRECTIVES (Y/N):  N  HEALTH MAINTENANCE: Social History   Tobacco Use   Smoking status: Never    Passive exposure: Current   Smokeless tobacco: Never  Vaping Use   Vaping status: Never Used  Substance Use Topics   Alcohol use: Not Currently   Drug use: Never     Colonoscopy:  PAP:  Bone density:  Lipid panel:  No Known Allergies  Current Outpatient Medications  Medication Sig Dispense Refill   dicyclomine  (BENTYL ) 20 MG tablet Take 1 tablet (20 mg total) by mouth 2 (two) times daily as needed for spasms. 20 tablet 0   glucagon 1 MG injection Inject 1 mg into the muscle as needed.     insulin aspart (NOVOLOG FLEXPEN) 100 UNIT/ML FlexPen Inject 20 Units into the skin 3 (three) times daily before meals. Patient taking 20 units before breakfast and lunch, 30 units before supper     metFORMIN (GLUCOPHAGE-XR) 500 MG 24 hr tablet Take by mouth.     norelgestromin-ethinyl estradiol (XULANE) 150-35 MCG/24HR transdermal patch Place 1 patch onto the skin once a week.     Vitamin D, Ergocalciferol, (DRISDOL) 1.25 MG (50000 UT) CAPS capsule Take 50,000 Units by mouth once a week.     methocarbamol  (ROBAXIN ) 500 MG tablet Take  1 tablet (500 mg total) by mouth 2 (two) times daily. (Patient not taking: Reported on 05/09/2024) 20 tablet 0   ondansetron  (ZOFRAN ) 4 MG tablet Take 1 tablet (4 mg total) by mouth every 6 (six) hours as needed for nausea or vomiting. (Patient not taking: Reported on 05/09/2024) 12 tablet 0   No current facility-administered  medications for this visit.    OBJECTIVE: Vitals:   05/09/24 1304  BP: 123/82  Pulse: 84  Resp: 16  Temp: 98.7 F (37.1 C)  SpO2: 100%     Body mass index is 44.64 kg/m.    ECOG FS:0 - Asymptomatic  General: Well-developed, well-nourished, no acute distress. Eyes: Pink conjunctiva, anicteric sclera. HEENT: Normocephalic, moist mucous membranes. Lungs: No audible wheezing or coughing. Heart: Regular rate and rhythm. Abdomen: Soft, nontender, no obvious distention. Musculoskeletal: No edema, cyanosis, or clubbing. Neuro: Alert, answering all questions appropriately. Cranial nerves grossly intact. Skin: No rashes or petechiae noted. Psych: Normal affect.  LAB RESULTS:  Lab Results  Component Value Date   NA 136 11/13/2022   K 4.2 11/13/2022   CL 102 11/13/2022   CO2 23 11/13/2022   GLUCOSE 271 (H) 11/13/2022   BUN 11 11/13/2022   CREATININE 0.83 11/13/2022   CALCIUM 9.4 11/13/2022   PROT 7.9 11/13/2022   ALBUMIN 3.6 11/13/2022   AST 18 11/13/2022   ALT 12 11/13/2022   ALKPHOS 94 11/13/2022   BILITOT 0.3 11/13/2022   GFRNONAA >60 11/13/2022    Lab Results  Component Value Date   WBC 7.1 05/06/2024   NEUTROABS 3.9 05/06/2024   HGB 11.8 (L) 05/06/2024   HCT 37.7 05/06/2024   MCV 80.2 05/06/2024   PLT 473 (H) 05/06/2024   Lab Results  Component Value Date   IRON  43 05/06/2024   TIBC 365 05/06/2024   IRONPCTSAT 12 05/06/2024    Lab Results  Component Value Date   FERRITIN 32 05/06/2024     STUDIES: No results found.  ASSESSMENT: Iron  deficiency anemia.  PLAN:    Iron  deficiency anemia: Resolved.  Likely secondary to menses.  By report patient had a colonoscopy on May 25, 2023 but we do not have these results.  Patient's hemoglobin is essentially within normal limits at 11.8 and her iron  stores continue to be within normal limits. Previously, all of her laboratory work was also either negative or within normal limits.  She does not require  additional IV Venofer  today.  Patient last received treatment on March 27, 2023.  No further intervention is needed.  After discussion with the patient, is agreed upon that no further follow-up is necessary.  Please refer patient back if there are any questions or concerns.   Thrombocytosis: Chronic and unchanged. Heavy menses: Continue to follow-up with OB/GYN as needed.  I spent a total of 20 minutes reviewing chart data, face-to-face evaluation with the patient, counseling and coordination of care as detailed above.  Patient expressed understanding and was in agreement with this plan. She also understands that She can call clinic at any time with any questions, concerns, or complaints.    Evalene JINNY Reusing, MD   05/09/2024 2:02 PM
